# Patient Record
Sex: Male | Born: 1935 | Race: White | Hispanic: No | Smoking: Never smoker
Health system: Southern US, Community
[De-identification: ages and names within clinical notes are randomized; demographics above are authoritative.]

## PROBLEM LIST (undated history)

## (undated) DIAGNOSIS — M1990 Unspecified osteoarthritis, unspecified site: Secondary | ICD-10-CM

## (undated) DIAGNOSIS — J189 Pneumonia, unspecified organism: Secondary | ICD-10-CM

## (undated) DIAGNOSIS — J45909 Unspecified asthma, uncomplicated: Secondary | ICD-10-CM

## (undated) DIAGNOSIS — R011 Cardiac murmur, unspecified: Secondary | ICD-10-CM

---

## 2001-01-13 ENCOUNTER — Ambulatory Visit (HOSPITAL_COMMUNITY): Admission: RE | Admit: 2001-01-13 | Discharge: 2001-01-13 | Payer: Self-pay | Admitting: Thoracic Surgery

## 2001-01-13 ENCOUNTER — Encounter: Payer: Self-pay | Admitting: Thoracic Surgery

## 2004-12-24 ENCOUNTER — Ambulatory Visit: Payer: Self-pay | Admitting: Cardiology

## 2012-07-13 ENCOUNTER — Other Ambulatory Visit (HOSPITAL_COMMUNITY): Payer: Self-pay

## 2015-01-17 DIAGNOSIS — I10 Essential (primary) hypertension: Secondary | ICD-10-CM | POA: Diagnosis not present

## 2015-01-17 DIAGNOSIS — G459 Transient cerebral ischemic attack, unspecified: Secondary | ICD-10-CM | POA: Diagnosis not present

## 2015-01-17 DIAGNOSIS — M1990 Unspecified osteoarthritis, unspecified site: Secondary | ICD-10-CM | POA: Diagnosis not present

## 2015-01-17 DIAGNOSIS — E782 Mixed hyperlipidemia: Secondary | ICD-10-CM | POA: Diagnosis not present

## 2015-01-22 DIAGNOSIS — Z0001 Encounter for general adult medical examination with abnormal findings: Secondary | ICD-10-CM | POA: Diagnosis not present

## 2015-01-22 DIAGNOSIS — J301 Allergic rhinitis due to pollen: Secondary | ICD-10-CM | POA: Diagnosis not present

## 2015-01-28 DIAGNOSIS — R3915 Urgency of urination: Secondary | ICD-10-CM | POA: Diagnosis not present

## 2015-01-28 DIAGNOSIS — N480 Leukoplakia of penis: Secondary | ICD-10-CM | POA: Diagnosis not present

## 2015-07-01 DIAGNOSIS — Z23 Encounter for immunization: Secondary | ICD-10-CM | POA: Diagnosis not present

## 2015-07-31 DIAGNOSIS — J301 Allergic rhinitis due to pollen: Secondary | ICD-10-CM | POA: Diagnosis not present

## 2015-07-31 DIAGNOSIS — N400 Enlarged prostate without lower urinary tract symptoms: Secondary | ICD-10-CM | POA: Diagnosis not present

## 2015-07-31 DIAGNOSIS — J4530 Mild persistent asthma, uncomplicated: Secondary | ICD-10-CM | POA: Diagnosis not present

## 2015-07-31 DIAGNOSIS — E782 Mixed hyperlipidemia: Secondary | ICD-10-CM | POA: Diagnosis not present

## 2015-07-31 DIAGNOSIS — I10 Essential (primary) hypertension: Secondary | ICD-10-CM | POA: Diagnosis not present

## 2015-09-09 DIAGNOSIS — J40 Bronchitis, not specified as acute or chronic: Secondary | ICD-10-CM | POA: Diagnosis not present

## 2015-09-09 DIAGNOSIS — J0100 Acute maxillary sinusitis, unspecified: Secondary | ICD-10-CM | POA: Diagnosis not present

## 2015-10-23 DIAGNOSIS — J4541 Moderate persistent asthma with (acute) exacerbation: Secondary | ICD-10-CM | POA: Diagnosis not present

## 2015-10-23 DIAGNOSIS — J301 Allergic rhinitis due to pollen: Secondary | ICD-10-CM | POA: Diagnosis not present

## 2016-01-27 DIAGNOSIS — N480 Leukoplakia of penis: Secondary | ICD-10-CM | POA: Diagnosis not present

## 2016-01-27 DIAGNOSIS — R3915 Urgency of urination: Secondary | ICD-10-CM | POA: Diagnosis not present

## 2016-01-29 DIAGNOSIS — E782 Mixed hyperlipidemia: Secondary | ICD-10-CM | POA: Diagnosis not present

## 2016-01-29 DIAGNOSIS — M1990 Unspecified osteoarthritis, unspecified site: Secondary | ICD-10-CM | POA: Diagnosis not present

## 2016-01-29 DIAGNOSIS — I10 Essential (primary) hypertension: Secondary | ICD-10-CM | POA: Diagnosis not present

## 2016-02-02 DIAGNOSIS — I10 Essential (primary) hypertension: Secondary | ICD-10-CM | POA: Diagnosis not present

## 2016-02-02 DIAGNOSIS — J301 Allergic rhinitis due to pollen: Secondary | ICD-10-CM | POA: Diagnosis not present

## 2016-02-02 DIAGNOSIS — J4530 Mild persistent asthma, uncomplicated: Secondary | ICD-10-CM | POA: Diagnosis not present

## 2016-02-02 DIAGNOSIS — E782 Mixed hyperlipidemia: Secondary | ICD-10-CM | POA: Diagnosis not present

## 2016-02-02 DIAGNOSIS — N400 Enlarged prostate without lower urinary tract symptoms: Secondary | ICD-10-CM | POA: Diagnosis not present

## 2016-05-24 DIAGNOSIS — Z23 Encounter for immunization: Secondary | ICD-10-CM | POA: Diagnosis not present

## 2016-09-09 DIAGNOSIS — E782 Mixed hyperlipidemia: Secondary | ICD-10-CM | POA: Diagnosis not present

## 2016-09-09 DIAGNOSIS — G459 Transient cerebral ischemic attack, unspecified: Secondary | ICD-10-CM | POA: Diagnosis not present

## 2016-09-09 DIAGNOSIS — I10 Essential (primary) hypertension: Secondary | ICD-10-CM | POA: Diagnosis not present

## 2016-09-14 DIAGNOSIS — E782 Mixed hyperlipidemia: Secondary | ICD-10-CM | POA: Diagnosis not present

## 2016-09-14 DIAGNOSIS — Z1212 Encounter for screening for malignant neoplasm of rectum: Secondary | ICD-10-CM | POA: Diagnosis not present

## 2016-09-14 DIAGNOSIS — N400 Enlarged prostate without lower urinary tract symptoms: Secondary | ICD-10-CM | POA: Diagnosis not present

## 2016-09-14 DIAGNOSIS — Z0001 Encounter for general adult medical examination with abnormal findings: Secondary | ICD-10-CM | POA: Diagnosis not present

## 2016-09-14 DIAGNOSIS — J4530 Mild persistent asthma, uncomplicated: Secondary | ICD-10-CM | POA: Diagnosis not present

## 2016-09-14 DIAGNOSIS — I10 Essential (primary) hypertension: Secondary | ICD-10-CM | POA: Diagnosis not present

## 2016-09-30 DIAGNOSIS — R195 Other fecal abnormalities: Secondary | ICD-10-CM | POA: Diagnosis not present

## 2016-10-04 DIAGNOSIS — Z8249 Family history of ischemic heart disease and other diseases of the circulatory system: Secondary | ICD-10-CM | POA: Diagnosis not present

## 2016-10-04 DIAGNOSIS — Z96641 Presence of right artificial hip joint: Secondary | ICD-10-CM | POA: Diagnosis not present

## 2016-10-04 DIAGNOSIS — I10 Essential (primary) hypertension: Secondary | ICD-10-CM | POA: Diagnosis not present

## 2016-10-04 DIAGNOSIS — Z823 Family history of stroke: Secondary | ICD-10-CM | POA: Diagnosis not present

## 2016-10-04 DIAGNOSIS — J45909 Unspecified asthma, uncomplicated: Secondary | ICD-10-CM | POA: Diagnosis not present

## 2016-10-04 DIAGNOSIS — Z881 Allergy status to other antibiotic agents status: Secondary | ICD-10-CM | POA: Diagnosis not present

## 2016-10-04 DIAGNOSIS — E785 Hyperlipidemia, unspecified: Secondary | ICD-10-CM | POA: Diagnosis not present

## 2016-10-04 DIAGNOSIS — Z882 Allergy status to sulfonamides status: Secondary | ICD-10-CM | POA: Diagnosis not present

## 2016-10-04 DIAGNOSIS — Z8489 Family history of other specified conditions: Secondary | ICD-10-CM | POA: Diagnosis not present

## 2016-10-04 DIAGNOSIS — Z79899 Other long term (current) drug therapy: Secondary | ICD-10-CM | POA: Diagnosis not present

## 2016-10-04 DIAGNOSIS — N400 Enlarged prostate without lower urinary tract symptoms: Secondary | ICD-10-CM | POA: Diagnosis not present

## 2016-10-04 DIAGNOSIS — Z7951 Long term (current) use of inhaled steroids: Secondary | ICD-10-CM | POA: Diagnosis not present

## 2016-10-04 DIAGNOSIS — R195 Other fecal abnormalities: Secondary | ICD-10-CM | POA: Diagnosis not present

## 2016-10-04 DIAGNOSIS — Z7982 Long term (current) use of aspirin: Secondary | ICD-10-CM | POA: Diagnosis not present

## 2016-10-18 DIAGNOSIS — R195 Other fecal abnormalities: Secondary | ICD-10-CM | POA: Diagnosis not present

## 2016-12-31 DIAGNOSIS — J309 Allergic rhinitis, unspecified: Secondary | ICD-10-CM | POA: Diagnosis not present

## 2016-12-31 DIAGNOSIS — R05 Cough: Secondary | ICD-10-CM | POA: Diagnosis not present

## 2017-03-09 DIAGNOSIS — L0231 Cutaneous abscess of buttock: Secondary | ICD-10-CM | POA: Diagnosis not present

## 2017-03-22 DIAGNOSIS — J4530 Mild persistent asthma, uncomplicated: Secondary | ICD-10-CM | POA: Diagnosis not present

## 2017-03-22 DIAGNOSIS — N400 Enlarged prostate without lower urinary tract symptoms: Secondary | ICD-10-CM | POA: Diagnosis not present

## 2017-03-22 DIAGNOSIS — E782 Mixed hyperlipidemia: Secondary | ICD-10-CM | POA: Diagnosis not present

## 2017-03-22 DIAGNOSIS — I10 Essential (primary) hypertension: Secondary | ICD-10-CM | POA: Diagnosis not present

## 2017-03-22 DIAGNOSIS — J301 Allergic rhinitis due to pollen: Secondary | ICD-10-CM | POA: Diagnosis not present

## 2017-04-25 DIAGNOSIS — L0231 Cutaneous abscess of buttock: Secondary | ICD-10-CM | POA: Diagnosis not present

## 2017-06-03 DIAGNOSIS — J309 Allergic rhinitis, unspecified: Secondary | ICD-10-CM | POA: Diagnosis not present

## 2017-06-03 DIAGNOSIS — Z23 Encounter for immunization: Secondary | ICD-10-CM | POA: Diagnosis not present

## 2017-06-09 DIAGNOSIS — N433 Hydrocele, unspecified: Secondary | ICD-10-CM | POA: Diagnosis not present

## 2017-06-25 DIAGNOSIS — N430 Encysted hydrocele: Secondary | ICD-10-CM | POA: Diagnosis not present

## 2017-09-23 DIAGNOSIS — M1990 Unspecified osteoarthritis, unspecified site: Secondary | ICD-10-CM | POA: Diagnosis not present

## 2017-09-23 DIAGNOSIS — G459 Transient cerebral ischemic attack, unspecified: Secondary | ICD-10-CM | POA: Diagnosis not present

## 2017-09-23 DIAGNOSIS — I10 Essential (primary) hypertension: Secondary | ICD-10-CM | POA: Diagnosis not present

## 2017-09-23 DIAGNOSIS — E782 Mixed hyperlipidemia: Secondary | ICD-10-CM | POA: Diagnosis not present

## 2017-09-27 DIAGNOSIS — I10 Essential (primary) hypertension: Secondary | ICD-10-CM | POA: Diagnosis not present

## 2017-09-27 DIAGNOSIS — N400 Enlarged prostate without lower urinary tract symptoms: Secondary | ICD-10-CM | POA: Diagnosis not present

## 2017-09-27 DIAGNOSIS — Z0001 Encounter for general adult medical examination with abnormal findings: Secondary | ICD-10-CM | POA: Diagnosis not present

## 2017-09-27 DIAGNOSIS — Z23 Encounter for immunization: Secondary | ICD-10-CM | POA: Diagnosis not present

## 2017-09-27 DIAGNOSIS — E782 Mixed hyperlipidemia: Secondary | ICD-10-CM | POA: Diagnosis not present

## 2017-10-19 DIAGNOSIS — L0231 Cutaneous abscess of buttock: Secondary | ICD-10-CM | POA: Diagnosis not present

## 2017-11-23 DIAGNOSIS — J301 Allergic rhinitis due to pollen: Secondary | ICD-10-CM | POA: Diagnosis not present

## 2017-12-12 DIAGNOSIS — R0602 Shortness of breath: Secondary | ICD-10-CM | POA: Diagnosis not present

## 2017-12-12 DIAGNOSIS — J0100 Acute maxillary sinusitis, unspecified: Secondary | ICD-10-CM | POA: Diagnosis not present

## 2017-12-20 DIAGNOSIS — H35371 Puckering of macula, right eye: Secondary | ICD-10-CM | POA: Diagnosis not present

## 2018-03-27 DIAGNOSIS — E782 Mixed hyperlipidemia: Secondary | ICD-10-CM | POA: Diagnosis not present

## 2018-03-27 DIAGNOSIS — I10 Essential (primary) hypertension: Secondary | ICD-10-CM | POA: Diagnosis not present

## 2018-03-27 DIAGNOSIS — G459 Transient cerebral ischemic attack, unspecified: Secondary | ICD-10-CM | POA: Diagnosis not present

## 2018-04-03 DIAGNOSIS — E782 Mixed hyperlipidemia: Secondary | ICD-10-CM | POA: Diagnosis not present

## 2018-04-03 DIAGNOSIS — J4530 Mild persistent asthma, uncomplicated: Secondary | ICD-10-CM | POA: Diagnosis not present

## 2018-04-03 DIAGNOSIS — D692 Other nonthrombocytopenic purpura: Secondary | ICD-10-CM | POA: Diagnosis not present

## 2018-04-03 DIAGNOSIS — I10 Essential (primary) hypertension: Secondary | ICD-10-CM | POA: Diagnosis not present

## 2018-06-08 DIAGNOSIS — Z23 Encounter for immunization: Secondary | ICD-10-CM | POA: Diagnosis not present

## 2018-07-10 DIAGNOSIS — A084 Viral intestinal infection, unspecified: Secondary | ICD-10-CM | POA: Diagnosis not present

## 2018-07-10 DIAGNOSIS — J301 Allergic rhinitis due to pollen: Secondary | ICD-10-CM | POA: Diagnosis not present

## 2018-08-07 DIAGNOSIS — M25551 Pain in right hip: Secondary | ICD-10-CM | POA: Diagnosis not present

## 2018-08-07 DIAGNOSIS — L02214 Cutaneous abscess of groin: Secondary | ICD-10-CM | POA: Diagnosis not present

## 2018-10-06 DIAGNOSIS — D509 Iron deficiency anemia, unspecified: Secondary | ICD-10-CM | POA: Diagnosis not present

## 2018-10-06 DIAGNOSIS — G459 Transient cerebral ischemic attack, unspecified: Secondary | ICD-10-CM | POA: Diagnosis not present

## 2018-10-06 DIAGNOSIS — D519 Vitamin B12 deficiency anemia, unspecified: Secondary | ICD-10-CM | POA: Diagnosis not present

## 2018-10-06 DIAGNOSIS — N183 Chronic kidney disease, stage 3 (moderate): Secondary | ICD-10-CM | POA: Diagnosis not present

## 2018-10-06 DIAGNOSIS — Z7689 Persons encountering health services in other specified circumstances: Secondary | ICD-10-CM | POA: Diagnosis not present

## 2018-10-06 DIAGNOSIS — I10 Essential (primary) hypertension: Secondary | ICD-10-CM | POA: Diagnosis not present

## 2018-10-06 DIAGNOSIS — D692 Other nonthrombocytopenic purpura: Secondary | ICD-10-CM | POA: Diagnosis not present

## 2018-10-06 DIAGNOSIS — D529 Folate deficiency anemia, unspecified: Secondary | ICD-10-CM | POA: Diagnosis not present

## 2018-10-06 DIAGNOSIS — E782 Mixed hyperlipidemia: Secondary | ICD-10-CM | POA: Diagnosis not present

## 2018-10-10 DIAGNOSIS — Z0001 Encounter for general adult medical examination with abnormal findings: Secondary | ICD-10-CM | POA: Diagnosis not present

## 2018-10-10 DIAGNOSIS — Z23 Encounter for immunization: Secondary | ICD-10-CM | POA: Diagnosis not present

## 2018-10-10 DIAGNOSIS — I10 Essential (primary) hypertension: Secondary | ICD-10-CM | POA: Diagnosis not present

## 2018-10-10 DIAGNOSIS — Z1212 Encounter for screening for malignant neoplasm of rectum: Secondary | ICD-10-CM | POA: Diagnosis not present

## 2018-10-23 DIAGNOSIS — J301 Allergic rhinitis due to pollen: Secondary | ICD-10-CM | POA: Diagnosis not present

## 2018-11-21 DIAGNOSIS — D509 Iron deficiency anemia, unspecified: Secondary | ICD-10-CM | POA: Diagnosis not present

## 2018-11-21 DIAGNOSIS — N183 Chronic kidney disease, stage 3 (moderate): Secondary | ICD-10-CM | POA: Diagnosis not present

## 2019-01-03 DIAGNOSIS — N183 Chronic kidney disease, stage 3 (moderate): Secondary | ICD-10-CM | POA: Diagnosis not present

## 2019-01-03 DIAGNOSIS — I10 Essential (primary) hypertension: Secondary | ICD-10-CM | POA: Diagnosis not present

## 2019-01-03 DIAGNOSIS — D509 Iron deficiency anemia, unspecified: Secondary | ICD-10-CM | POA: Diagnosis not present

## 2019-01-09 DIAGNOSIS — Z23 Encounter for immunization: Secondary | ICD-10-CM | POA: Diagnosis not present

## 2019-03-01 DIAGNOSIS — D529 Folate deficiency anemia, unspecified: Secondary | ICD-10-CM | POA: Diagnosis not present

## 2019-03-01 DIAGNOSIS — D519 Vitamin B12 deficiency anemia, unspecified: Secondary | ICD-10-CM | POA: Diagnosis not present

## 2019-03-01 DIAGNOSIS — D649 Anemia, unspecified: Secondary | ICD-10-CM | POA: Diagnosis not present

## 2019-04-09 DIAGNOSIS — Z0000 Encounter for general adult medical examination without abnormal findings: Secondary | ICD-10-CM | POA: Diagnosis not present

## 2019-04-09 DIAGNOSIS — N183 Chronic kidney disease, stage 3 (moderate): Secondary | ICD-10-CM | POA: Diagnosis not present

## 2019-04-09 DIAGNOSIS — D649 Anemia, unspecified: Secondary | ICD-10-CM | POA: Diagnosis not present

## 2019-04-09 DIAGNOSIS — D529 Folate deficiency anemia, unspecified: Secondary | ICD-10-CM | POA: Diagnosis not present

## 2019-04-09 DIAGNOSIS — R5383 Other fatigue: Secondary | ICD-10-CM | POA: Diagnosis not present

## 2019-04-09 DIAGNOSIS — D519 Vitamin B12 deficiency anemia, unspecified: Secondary | ICD-10-CM | POA: Diagnosis not present

## 2019-04-12 DIAGNOSIS — E782 Mixed hyperlipidemia: Secondary | ICD-10-CM | POA: Diagnosis not present

## 2019-04-12 DIAGNOSIS — J4540 Moderate persistent asthma, uncomplicated: Secondary | ICD-10-CM | POA: Diagnosis not present

## 2019-04-12 DIAGNOSIS — I10 Essential (primary) hypertension: Secondary | ICD-10-CM | POA: Diagnosis not present

## 2019-04-12 DIAGNOSIS — D692 Other nonthrombocytopenic purpura: Secondary | ICD-10-CM | POA: Diagnosis not present

## 2019-05-17 DIAGNOSIS — Z23 Encounter for immunization: Secondary | ICD-10-CM | POA: Diagnosis not present

## 2019-08-30 DIAGNOSIS — I10 Essential (primary) hypertension: Secondary | ICD-10-CM | POA: Diagnosis not present

## 2019-08-30 DIAGNOSIS — E782 Mixed hyperlipidemia: Secondary | ICD-10-CM | POA: Diagnosis not present

## 2019-09-28 DIAGNOSIS — I10 Essential (primary) hypertension: Secondary | ICD-10-CM | POA: Diagnosis not present

## 2019-09-28 DIAGNOSIS — E789 Disorder of lipoprotein metabolism, unspecified: Secondary | ICD-10-CM | POA: Diagnosis not present

## 2019-10-15 DIAGNOSIS — Z0001 Encounter for general adult medical examination with abnormal findings: Secondary | ICD-10-CM | POA: Diagnosis not present

## 2019-10-15 DIAGNOSIS — E782 Mixed hyperlipidemia: Secondary | ICD-10-CM | POA: Diagnosis not present

## 2019-10-15 DIAGNOSIS — I10 Essential (primary) hypertension: Secondary | ICD-10-CM | POA: Diagnosis not present

## 2019-10-15 DIAGNOSIS — D692 Other nonthrombocytopenic purpura: Secondary | ICD-10-CM | POA: Diagnosis not present

## 2019-10-26 DIAGNOSIS — E7849 Other hyperlipidemia: Secondary | ICD-10-CM | POA: Diagnosis not present

## 2019-10-26 DIAGNOSIS — I10 Essential (primary) hypertension: Secondary | ICD-10-CM | POA: Diagnosis not present

## 2019-12-28 DIAGNOSIS — E7849 Other hyperlipidemia: Secondary | ICD-10-CM | POA: Diagnosis not present

## 2019-12-28 DIAGNOSIS — I10 Essential (primary) hypertension: Secondary | ICD-10-CM | POA: Diagnosis not present

## 2019-12-28 DIAGNOSIS — N183 Chronic kidney disease, stage 3 unspecified: Secondary | ICD-10-CM | POA: Diagnosis not present

## 2020-02-06 DIAGNOSIS — J4540 Moderate persistent asthma, uncomplicated: Secondary | ICD-10-CM | POA: Diagnosis not present

## 2020-02-06 DIAGNOSIS — I10 Essential (primary) hypertension: Secondary | ICD-10-CM | POA: Diagnosis not present

## 2020-02-06 DIAGNOSIS — J301 Allergic rhinitis due to pollen: Secondary | ICD-10-CM | POA: Diagnosis not present

## 2020-02-06 DIAGNOSIS — E782 Mixed hyperlipidemia: Secondary | ICD-10-CM | POA: Diagnosis not present

## 2020-02-06 DIAGNOSIS — D692 Other nonthrombocytopenic purpura: Secondary | ICD-10-CM | POA: Diagnosis not present

## 2020-02-06 DIAGNOSIS — Z0001 Encounter for general adult medical examination with abnormal findings: Secondary | ICD-10-CM | POA: Diagnosis not present

## 2020-02-18 DIAGNOSIS — S20212A Contusion of left front wall of thorax, initial encounter: Secondary | ICD-10-CM | POA: Diagnosis not present

## 2020-02-22 DIAGNOSIS — R300 Dysuria: Secondary | ICD-10-CM | POA: Diagnosis not present

## 2020-02-22 DIAGNOSIS — R3912 Poor urinary stream: Secondary | ICD-10-CM | POA: Diagnosis not present

## 2020-02-22 DIAGNOSIS — R350 Frequency of micturition: Secondary | ICD-10-CM | POA: Diagnosis not present

## 2020-02-22 DIAGNOSIS — R3914 Feeling of incomplete bladder emptying: Secondary | ICD-10-CM | POA: Diagnosis not present

## 2020-02-27 DIAGNOSIS — E7849 Other hyperlipidemia: Secondary | ICD-10-CM | POA: Diagnosis not present

## 2020-02-27 DIAGNOSIS — D509 Iron deficiency anemia, unspecified: Secondary | ICD-10-CM | POA: Diagnosis not present

## 2020-02-27 DIAGNOSIS — I129 Hypertensive chronic kidney disease with stage 1 through stage 4 chronic kidney disease, or unspecified chronic kidney disease: Secondary | ICD-10-CM | POA: Diagnosis not present

## 2020-02-27 DIAGNOSIS — N183 Chronic kidney disease, stage 3 unspecified: Secondary | ICD-10-CM | POA: Diagnosis not present

## 2020-04-17 ENCOUNTER — Ambulatory Visit: Payer: Self-pay | Admitting: Urology

## 2020-04-29 DIAGNOSIS — N183 Chronic kidney disease, stage 3 unspecified: Secondary | ICD-10-CM | POA: Diagnosis not present

## 2020-04-29 DIAGNOSIS — D509 Iron deficiency anemia, unspecified: Secondary | ICD-10-CM | POA: Diagnosis not present

## 2020-04-29 DIAGNOSIS — E7849 Other hyperlipidemia: Secondary | ICD-10-CM | POA: Diagnosis not present

## 2020-04-29 DIAGNOSIS — I129 Hypertensive chronic kidney disease with stage 1 through stage 4 chronic kidney disease, or unspecified chronic kidney disease: Secondary | ICD-10-CM | POA: Diagnosis not present

## 2020-05-12 ENCOUNTER — Ambulatory Visit (INDEPENDENT_AMBULATORY_CARE_PROVIDER_SITE_OTHER): Payer: Medicare Other | Admitting: Urology

## 2020-05-12 ENCOUNTER — Encounter: Payer: Self-pay | Admitting: Urology

## 2020-05-12 ENCOUNTER — Other Ambulatory Visit: Payer: Self-pay

## 2020-05-12 VITALS — Temp 98.1°F | Ht 75.0 in | Wt 205.0 lb

## 2020-05-12 DIAGNOSIS — N401 Enlarged prostate with lower urinary tract symptoms: Secondary | ICD-10-CM | POA: Insufficient documentation

## 2020-05-12 DIAGNOSIS — R3914 Feeling of incomplete bladder emptying: Secondary | ICD-10-CM | POA: Diagnosis not present

## 2020-05-12 DIAGNOSIS — N5201 Erectile dysfunction due to arterial insufficiency: Secondary | ICD-10-CM

## 2020-05-12 LAB — URINALYSIS, ROUTINE W REFLEX MICROSCOPIC
Bilirubin, UA: NEGATIVE
Glucose, UA: NEGATIVE
Ketones, UA: NEGATIVE
Leukocytes,UA: NEGATIVE
Nitrite, UA: NEGATIVE
Protein,UA: NEGATIVE
RBC, UA: NEGATIVE
Specific Gravity, UA: 1.015 (ref 1.005–1.030)
Urobilinogen, Ur: 0.2 mg/dL (ref 0.2–1.0)
pH, UA: 7 (ref 5.0–7.5)

## 2020-05-12 LAB — BLADDER SCAN AMB NON-IMAGING: Scan Result: 141

## 2020-05-12 MED ORDER — TAMSULOSIN HCL 0.4 MG PO CAPS: 0.4000 mg | ORAL_CAPSULE | Freq: Every day | ORAL | 3 refills | Status: DC

## 2020-05-12 MED ORDER — CLOTRIMAZOLE-BETAMETHASONE 1-0.05 % EX CREA: 1.0000 "application " | TOPICAL_CREAM | Freq: Two times a day (BID) | CUTANEOUS | 0 refills | Status: DC

## 2020-05-12 MED ORDER — TADALAFIL 20 MG PO TABS: 20.0000 mg | ORAL_TABLET | Freq: Every day | ORAL | 1 refills | Status: DC | PRN

## 2020-05-12 NOTE — Progress Notes (Signed)
05/12/2020 3:32 PM   Timothy Quinn 02/26/1932 [id number]  Referring provider: No referring provider defined for this encounter.  followup BPH  HPI: [name] is a 53WC here for followup for BPH with weak stream. He was seen by Dr. Gloriann Loan at [facility] in JUne 2021 and place don 2 weeks of doxycycline and flomax 0.4mg  daily. While on the flomax the stream improved. Nocturia improved to 1x. Urgency and frequency resolved. He stopped the flomax 6 weeks ago when he ran out of the medication. The stream worsened and nocturia worsened to 2-3x. No dysuria or hematuria. UA normal today He has had issues getting an erection for over 2 years. He has never tried a PDE5. Good exercise tolerance    PMH: No past medical history on file.  Surgical History:   Home Medications:  Allergies as of 05/12/2020   Not on File     Medication List       Accurate as of May 12, 2020  3:32 PM. If you have any questions, ask your nurse or doctor.        Advair Diskus 250-50 MCG/DOSE Aepb Generic drug: Fluticasone-Salmeterol SMARTSIG:1 Unspecified By Mouth Twice Daily   tamsulosin 0.4 MG Caps capsule Commonly known as: FLOMAX Take 0.4 mg by mouth daily.       Allergies: Not on File  Family History: No family history on file.  Social History:  has no history on file for tobacco use, alcohol use, and drug use.  ROS: All other review of systems were reviewed and are negative except what is noted above in HPI  Physical Exam: Temp 98.1 F (36.7 C)   Ht 6\' 3"  (1.905 m)   Wt 205 lb (93 kg)   BMI 25.62 kg/m   Constitutional:  Alert and oriented, No acute distress. HEENT: [place] AT, moist mucus membranes.  Trachea midline, no masses. Cardiovascular: No clubbing, cyanosis, or edema. Respiratory: Normal respiratory effort, no increased work of breathing. GI: Abdomen is soft, nontender, nondistended, no abdominal masses GU: No CVA tenderness.  Lymph: No cervical or inguinal  lymphadenopathy. Skin: No rashes, bruises or suspicious lesions. Neurologic: Grossly intact, no focal deficits, moving all 4 extremities. Psychiatric: Normal mood and affect.  Laboratory Data: No results found for: WBC, HGB, HCT, MCV, PLT  No results found for: CREATININE  No results found for: PSA  No results found for: TESTOSTERONE  No results found for: HGBA1C  Urinalysis    Component Value Date/Time   APPEARANCEUR Clear 05/12/2020 1513   GLUCOSEU Negative 05/12/2020 1513   BILIRUBINUR Negative 05/12/2020 1513   PROTEINUR Negative 05/12/2020 1513   NITRITE Negative 05/12/2020 1513   LEUKOCYTESUR Negative 05/12/2020 1513    Lab Results  Component Value Date   LABMICR Comment 05/12/2020    Pertinent Imaging:  No results found for this or any previous visit.  No results found for this or any previous visit.  No results found for this or any previous visit.  No results found for this or any previous visit.  No results found for this or any previous visit.  No results found for this or any previous visit.  No results found for this or any previous visit.  No results found for this or any previous visit.   Assessment & Plan:    1. Feeling of incomplete bladder emptying -restart flomax 0.4mg  daily - BLADDER SCAN AMB NON-IMAGING - Urinalysis, Routine w reflex microscopic  2. Benign localized prostatic hyperplasia with lower urinary tract symptoms (  LUTS) -restart flomax 0.4mg  daily  3. Erectile dysfunction -tadalafil 20mg  prn  No follow-ups on file.  Nicolette Bang, MD  [facility]

## 2020-05-12 NOTE — Patient Instructions (Signed)

## 2020-05-12 NOTE — Progress Notes (Signed)
PVR=141  Urological Symptom Review  Patient is experiencing the following symptoms: none   Review of Systems  Gastrointestinal (upper)  : Negative for upper GI symptoms  Gastrointestinal (lower) : Negative for lower GI symptoms  Constitutional : Negative for symptoms  Skin: Negative for skin symptoms  Eyes: Negative for eye symptoms  Ear/Nose/Throat : Negative for Ear/Nose/Throat symptoms  Hematologic/Lymphatic: Negative for Hematologic/Lymphatic symptoms  Cardiovascular : Negative for cardiovascular symptoms  Respiratory : Negative for respiratory symptoms  Endocrine: Negative for endocrine symptoms  Musculoskeletal: Negative for musculoskeletal symptoms  Neurological: Negative for neurological symptoms  Psychologic: Negative for psychiatric symptoms

## 2020-05-29 DIAGNOSIS — E7849 Other hyperlipidemia: Secondary | ICD-10-CM | POA: Diagnosis not present

## 2020-05-29 DIAGNOSIS — I129 Hypertensive chronic kidney disease with stage 1 through stage 4 chronic kidney disease, or unspecified chronic kidney disease: Secondary | ICD-10-CM | POA: Diagnosis not present

## 2020-05-29 DIAGNOSIS — D509 Iron deficiency anemia, unspecified: Secondary | ICD-10-CM | POA: Diagnosis not present

## 2020-05-29 DIAGNOSIS — N183 Chronic kidney disease, stage 3 unspecified: Secondary | ICD-10-CM | POA: Diagnosis not present

## 2020-06-10 DIAGNOSIS — Z23 Encounter for immunization: Secondary | ICD-10-CM | POA: Diagnosis not present

## 2020-06-28 DIAGNOSIS — N183 Chronic kidney disease, stage 3 unspecified: Secondary | ICD-10-CM | POA: Diagnosis not present

## 2020-06-28 DIAGNOSIS — I129 Hypertensive chronic kidney disease with stage 1 through stage 4 chronic kidney disease, or unspecified chronic kidney disease: Secondary | ICD-10-CM | POA: Diagnosis not present

## 2020-06-28 DIAGNOSIS — E7849 Other hyperlipidemia: Secondary | ICD-10-CM | POA: Diagnosis not present

## 2020-07-23 DIAGNOSIS — Z23 Encounter for immunization: Secondary | ICD-10-CM | POA: Diagnosis not present

## 2020-08-06 DIAGNOSIS — D519 Vitamin B12 deficiency anemia, unspecified: Secondary | ICD-10-CM | POA: Diagnosis not present

## 2020-08-06 DIAGNOSIS — N183 Chronic kidney disease, stage 3 unspecified: Secondary | ICD-10-CM | POA: Diagnosis not present

## 2020-08-06 DIAGNOSIS — D649 Anemia, unspecified: Secondary | ICD-10-CM | POA: Diagnosis not present

## 2020-08-06 DIAGNOSIS — D529 Folate deficiency anemia, unspecified: Secondary | ICD-10-CM | POA: Diagnosis not present

## 2020-08-13 DIAGNOSIS — J4540 Moderate persistent asthma, uncomplicated: Secondary | ICD-10-CM | POA: Diagnosis not present

## 2020-08-13 DIAGNOSIS — I10 Essential (primary) hypertension: Secondary | ICD-10-CM | POA: Diagnosis not present

## 2020-08-13 DIAGNOSIS — D692 Other nonthrombocytopenic purpura: Secondary | ICD-10-CM | POA: Diagnosis not present

## 2020-08-13 DIAGNOSIS — E782 Mixed hyperlipidemia: Secondary | ICD-10-CM | POA: Diagnosis not present

## 2020-08-29 DIAGNOSIS — D509 Iron deficiency anemia, unspecified: Secondary | ICD-10-CM | POA: Diagnosis not present

## 2020-08-29 DIAGNOSIS — N183 Chronic kidney disease, stage 3 unspecified: Secondary | ICD-10-CM | POA: Diagnosis not present

## 2020-08-29 DIAGNOSIS — E7849 Other hyperlipidemia: Secondary | ICD-10-CM | POA: Diagnosis not present

## 2020-08-29 DIAGNOSIS — I129 Hypertensive chronic kidney disease with stage 1 through stage 4 chronic kidney disease, or unspecified chronic kidney disease: Secondary | ICD-10-CM | POA: Diagnosis not present

## 2020-09-03 DIAGNOSIS — Z1329 Encounter for screening for other suspected endocrine disorder: Secondary | ICD-10-CM | POA: Diagnosis not present

## 2020-09-03 DIAGNOSIS — I10 Essential (primary) hypertension: Secondary | ICD-10-CM | POA: Diagnosis not present

## 2020-09-03 DIAGNOSIS — N183 Chronic kidney disease, stage 3 unspecified: Secondary | ICD-10-CM | POA: Diagnosis not present

## 2020-09-11 DIAGNOSIS — R9389 Abnormal findings on diagnostic imaging of other specified body structures: Secondary | ICD-10-CM | POA: Diagnosis not present

## 2020-09-11 DIAGNOSIS — E349 Endocrine disorder, unspecified: Secondary | ICD-10-CM | POA: Diagnosis not present

## 2020-09-11 DIAGNOSIS — E213 Hyperparathyroidism, unspecified: Secondary | ICD-10-CM | POA: Diagnosis not present

## 2020-10-27 DIAGNOSIS — I129 Hypertensive chronic kidney disease with stage 1 through stage 4 chronic kidney disease, or unspecified chronic kidney disease: Secondary | ICD-10-CM | POA: Diagnosis not present

## 2020-10-27 DIAGNOSIS — D509 Iron deficiency anemia, unspecified: Secondary | ICD-10-CM | POA: Diagnosis not present

## 2020-10-27 DIAGNOSIS — E7849 Other hyperlipidemia: Secondary | ICD-10-CM | POA: Diagnosis not present

## 2020-10-27 DIAGNOSIS — N183 Chronic kidney disease, stage 3 unspecified: Secondary | ICD-10-CM | POA: Diagnosis not present

## 2020-10-28 ENCOUNTER — Ambulatory Visit: Payer: Self-pay | Admitting: Surgery

## 2020-10-28 DIAGNOSIS — E210 Primary hyperparathyroidism: Secondary | ICD-10-CM | POA: Diagnosis not present

## 2020-10-30 ENCOUNTER — Other Ambulatory Visit (HOSPITAL_COMMUNITY): Payer: Self-pay | Admitting: Surgery

## 2020-10-30 DIAGNOSIS — E210 Primary hyperparathyroidism: Secondary | ICD-10-CM

## 2020-11-12 ENCOUNTER — Ambulatory Visit: Payer: Self-pay | Admitting: Urology

## 2020-11-12 DIAGNOSIS — R3914 Feeling of incomplete bladder emptying: Secondary | ICD-10-CM

## 2020-11-17 ENCOUNTER — Encounter (HOSPITAL_COMMUNITY): Payer: Self-pay | Admitting: Surgery

## 2020-11-17 DIAGNOSIS — E210 Primary hyperparathyroidism: Secondary | ICD-10-CM | POA: Diagnosis present

## 2020-11-17 NOTE — H&P (Signed)
General Surgery [facility]  Timothy Quinn DOB: 09-28-1931 Widowed / Language: Timothy Quinn / Race: White Male   History of Present Illness   The patient is a 87 year old male who presents with primary hyperparathyroidism.  CHIEF COMPLAINT: primary hyperparathyroidism  Patient is referred by Dr. Gar Quinn for surgical evaluation and management of primary hyperparathyroidism. Patient was noted on routine laboratory studies to have an elevated serum calcium level of 10.9. Subsequent intact PTH level was noted to be elevated at 72. Patient underwent nuclear medicine parathyroid scan on September 11, 2020. This localized a left inferior parathyroid adenoma. Patient has experienced fatigue. He is having headaches. He denies nephrolithiasis. He has had no prior head or neck surgery. There is no family history of parathyroid disease. Patient is referred at this time for evaluation for minimally invasive parathyroidectomy. He is accompanied by his daughter.   Past Surgical History Cataract Surgery  Bilateral. Hip Surgery  Right.  Diagnostic Studies History  Colonoscopy  5-10 years ago  Allergies Sulfa Antibiotics  Cipro *FLUOROQUINOLONES*  Allergies Reconciled   Medication History  Advair Diskus (250-50MCG/DOSE Aero Pow Br Act, Inhalation) Active. Clotrimazole-Betamethasone (1-0.05% Cream, External) Active. Doxycycline Hyclate (100MG  Tablet, Oral) Active. Lisinopril-hydroCHLOROthiazide (10-12.5MG  Tablet, Oral) Active. Lisinopril-hydroCHLOROthiazide (20-25MG  Tablet, Oral) Active. Tadalafil (20MG  Tablet, Oral) Active. Tamsulosin HCl (0.4MG  Capsule, Oral) Active. ProAir HFA (108 (90 Base)MCG/ACT Aerosol Soln, Inhalation) Active. Medications Reconciled  Social History  Caffeine use  Carbonated beverages, Coffee. No alcohol use  No drug use  Tobacco use  Former smoker.  Family History  Bleeding disorder  Daughter. Cerebrovascular  Accident  Father. Migraine Headache  Daughter.  Other Problems  Arthritis  Asthma  Gastric Ulcer  Gastroesophageal Reflux Disease  Heart murmur  Hemorrhoids   Review of Systems  General Not Present- Appetite Loss, Chills, Fatigue, Fever, Night Sweats, Weight Gain and Weight Loss. Skin Not Present- Change in Wart/Mole, Dryness, Hives, Jaundice, New Lesions, Non-Healing Wounds, Rash and Ulcer. HEENT Present- Hearing Loss, Seasonal Allergies, Sinus Pain and Wears glasses/contact lenses. Not Present- Earache, Hoarseness, Nose Bleed, Oral Ulcers, Ringing in the Ears, Sore Throat, Visual Disturbances and Yellow Eyes. Respiratory Present- Snoring and Wheezing. Not Present- Bloody sputum, Chronic Cough and Difficulty Breathing. Breast Not Present- Breast Mass, Breast Pain, Nipple Discharge and Skin Changes. Cardiovascular Present- Shortness of Breath and Swelling of Extremities. Not Present- Chest Pain, Difficulty Breathing Lying Down, Leg Cramps, Palpitations and Rapid Heart Rate. Gastrointestinal Present- Bloating, Change in Bowel Habits, Gets full quickly at meals and Hemorrhoids. Not Present- Abdominal Pain, Bloody Stool, Chronic diarrhea, Constipation, Difficulty Swallowing, Excessive gas, Indigestion, Nausea, Rectal Pain and Vomiting. Male Genitourinary Present- Change in Urinary Stream, Frequency, Nocturia and Urgency. Not Present- Blood in Urine, Impotence, Painful Urination and Urine Leakage. Musculoskeletal Present- Back Pain, Joint Pain, Joint Stiffness and Swelling of Extremities. Not Present- Muscle Pain and Muscle Weakness. Neurological Present- Headaches. Not Present- Decreased Memory, Fainting, Numbness, Seizures, Tingling, Tremor, Trouble walking and Weakness. Psychiatric Not Present- Anxiety, Bipolar, Change in Sleep Pattern, Depression, Fearful and Frequent crying. Endocrine Not Present- Cold Intolerance, Excessive Hunger, Hair Changes, Heat Intolerance, Hot flashes and New  Diabetes. Hematology Present- Blood Thinners and Easy Bruising. Not Present- Excessive bleeding, Gland problems, HIV and Persistent Infections.  Vitals  Weight: 215 lb Temp.: 98.28F  Pulse: 85 (Regular)  P.OX: 95% (Room air) BP: 160/80(Sitting, Left Arm, Standard)  Physical Exam   GENERAL APPEARANCE Development: normal Nutritional status: normal Gross deformities: none  SKIN Rash, lesions, ulcers:  none Induration, erythema: none Nodules: none palpable  EYES Conjunctiva and lids: normal Pupils: equal and reactive Iris: normal bilaterally  EARS, NOSE, MOUTH, THROAT External ears: no lesion or deformity External nose: no lesion or deformity Hearing: grossly normal Due to Covid-19 pandemic, patient is wearing a mask.  NECK Symmetric: yes Trachea: midline Thyroid: no palpable nodules in the thyroid bed  CHEST Respiratory effort: normal Retraction or accessory muscle use: no Breath sounds: normal bilaterally Rales, rhonchi, wheeze: none  CARDIOVASCULAR Auscultation: regular rhythm, normal rate Murmurs: none Pulses: radial pulse 2+ palpable Lower extremity edema: none  MUSCULOSKELETAL Station and gait: normal Digits and nails: no clubbing or cyanosis Muscle strength: grossly normal all extremities Range of motion: grossly normal all extremities Deformity: none  LYMPHATIC Cervical: none palpable Supraclavicular: none palpable  PSYCHIATRIC Oriented to person, place, and time: yes Mood and affect: normal for situation Judgment and insight: appropriate for situation    Assessment & Plan   PRIMARY HYPERPARATHYROIDISM (E21.0)  Patient is referred by his primary care physician for surgical evaluation and management of primary hyperparathyroidism.  Patient provided with a copy of "Parathyroid Surgery: Treatment for Your Parathyroid Gland Problem", published by [employer], 12 pages. Book reviewed and explained to patient during visit today.  Patient has  biochemical evidence of primary hyperparathyroidism. He has experienced chronic fatigue. Imaging studies localize a left inferior parathyroid adenoma. I would like to obtain an ultrasound examination of the neck to confirm the location of the adenoma and to rule out any concurrent thyroid disease. We discussed proceeding with minimally invasive parathyroidectomy as an outpatient surgical procedure. We discussed the size and location of the surgical incision. We discussed the hospital stay and his postoperative recovery. We discussed his return to physical activity. He understands and wishes to proceed with surgery in the near future. We will make arrangements for his ultrasound study in the near future as well.  The risks and benefits of the procedure have been discussed at length with the patient. The patient understands the proposed procedure, potential alternative treatments, and the course of recovery to be expected. All of the patient's questions have been answered at this time. The patient wishes to proceed with surgery.  Armandina Gemma, MD [facility] Office: [phone]

## 2020-11-19 NOTE — Progress Notes (Addendum)
DUE TO COVID-19 ONLY ONE VISITOR IS ALLOWED TO COME WITH YOU AND STAY IN THE WAITING ROOM ONLY DURING PRE OP AND PROCEDURE DAY OF SURGERY. THE 1 VISITOR  MAY VISIT WITH YOU AFTER SURGERY IN YOUR PRIVATE ROOM DURING VISITING HOURS ONLY!  YOU NEED TO HAVE A COVID 19 TEST ON__3/25/2022 _____ @_______ , THIS TEST MUST BE DONE BEFORE SURGERY,  COVID TESTING SITE [address] [place] [address], IT IS ON THE RIGHT GOING OUT [address] APPROXIMATELY  2 MINUTES PAST [employer] ON THE RIGHT. ONCE YOUR COVID TEST IS COMPLETED,  PLEASE BEGIN THE QUARANTINE INSTRUCTIONS AS OUTLINED IN YOUR HANDOUT.                Timothy Quinn  11/19/2020   Your procedure is scheduled on:    Report to [facility] Main  Entrance   Report to admitting at   [place] AM     Call this number if you have problems the morning of surgery [phone]    REMEMBER: NO  SOLID FOOD CANDY OR GUM AFTER MIDNIGHT. CLEAR LIQUIDS UNTIL   0745am       . NOTHING BY MOUTH EXCEPT CLEAR LIQUIDS UNTIL      0745am    . PLEASE FINISH ENSURE DRINK PER SURGEON ORDER  WHICH NEEDS TO BE COMPLETED AT   [id number]    .      CLEAR LIQUID DIET   Foods Allowed                                                                    Coffee and tea, regular and decaf                            Fruit ices (not with fruit pulp)                                      Iced Popsicles                                    Carbonated beverages, regular and diet                                    Cranberry, grape and apple juices Sports drinks like Gatorade Lightly seasoned clear broth or consume(fat free) Sugar, honey syrup ___________________________________________________________________      BRUSH YOUR TEETH MORNING OF SURGERY AND RINSE YOUR MOUTH OUT, NO CHEWING GUM CANDY OR MINTS.     Take these medicines the morning of surgery with A SIP OF WATER:  Inhalers as usual and bring   DO NOT TAKE ANY DIABETIC MEDICATIONS  DAY OF YOUR SURGERY                               You may not have any metal on your body including hair pins and  piercings  Do not wear jewelry, make-up, lotions, powders or perfumes, deodorant             Do not wear nail polish on your fingernails.  Do not shave  48 hours prior to surgery.              Men may shave face and neck.   Do not bring valuables to the hospital. [place].  Contacts, dentures or bridgework may not be worn into surgery.  Leave suitcase in the car. After surgery it may be brought to your room.     Patients discharged the day of surgery will not be allowed to drive home. IF YOU ARE HAVING SURGERY AND GOING HOME THE SAME DAY, YOU MUST HAVE AN ADULT TO DRIVE YOU HOME AND BE WITH YOU FOR 24 HOURS. YOU MAY GO HOME BY TAXI OR UBER OR ORTHERWISE, BUT AN ADULT MUST ACCOMPANY YOU HOME AND STAY WITH YOU FOR 24 HOURS.  Name and phone number of your driver:  Special Instructions: N/A              Please read over the following fact sheets you were given: _____________________________________________________________________  [employer] - Preparing for Surgery Before surgery, you can play an important role.  Because skin is not sterile, your skin needs to be as free of germs as possible.  You can reduce the number of germs on your skin by washing with CHG (chlorahexidine gluconate) soap before surgery.  CHG is an antiseptic cleaner which kills germs and bonds with the skin to continue killing germs even after washing. Please DO NOT use if you have an allergy to CHG or antibacterial soaps.  If your skin becomes reddened/irritated stop using the CHG and inform your nurse when you arrive at Short Stay. Do not shave (including legs and underarms) for at least 48 hours prior to the first CHG shower.  You may shave your face/neck. Please follow these instructions carefully:  1.  Shower with CHG Soap the night before  surgery and the  morning of Surgery.  2.  If you choose to wash your hair, wash your hair first as usual with your  normal  shampoo.  3.  After you shampoo, rinse your hair and body thoroughly to remove the  shampoo.                           4.  Use CHG as you would any other liquid soap.  You can apply chg directly  to the skin and wash                       Gently with a scrungie or clean washcloth.  5.  Apply the CHG Soap to your body ONLY FROM THE NECK DOWN.   Do not use on face/ open                           Wound or open sores. Avoid contact with eyes, ears mouth and genitals (private parts).                       Wash face,  Genitals (private parts) with your normal soap.             6.  Wash thoroughly, paying special attention to the area where your surgery  will be performed.  7.  Thoroughly rinse your body with warm water from the neck down.  8.  DO NOT shower/wash with your normal soap after using and rinsing off  the CHG Soap.                9.  Pat yourself dry with a clean towel.            10.  Wear clean pajamas.            11.  Place clean sheets on your bed the night of your first shower and do not  sleep with pets. Day of Surgery : Do not apply any lotions/deodorants the morning of surgery.  Please wear clean clothes to the hospital/surgery center.  FAILURE TO FOLLOW THESE INSTRUCTIONS MAY RESULT IN THE CANCELLATION OF YOUR SURGERY PATIENT SIGNATURE_________________________________  NURSE SIGNATURE__________________________________  ________________________________________________________________________

## 2020-11-20 ENCOUNTER — Other Ambulatory Visit: Payer: Self-pay

## 2020-11-20 ENCOUNTER — Ambulatory Visit (HOSPITAL_COMMUNITY)
Admission: RE | Admit: 2020-11-20 | Discharge: 2020-11-20 | Disposition: A | Discharge status: Home / Self Care | Payer: Medicare Other | Source: Ambulatory Visit | Attending: Surgery | Admitting: Surgery

## 2020-11-20 DIAGNOSIS — E210 Primary hyperparathyroidism: Secondary | ICD-10-CM | POA: Insufficient documentation

## 2020-11-21 ENCOUNTER — Other Ambulatory Visit: Payer: Self-pay

## 2020-11-21 ENCOUNTER — Encounter (HOSPITAL_COMMUNITY)
Admission: RE | Admit: 2020-11-21 | Discharge: 2020-11-21 | Disposition: A | Discharge status: Home / Self Care | Payer: Medicare Other | Source: Ambulatory Visit | Attending: Surgery | Admitting: Surgery

## 2020-11-21 ENCOUNTER — Other Ambulatory Visit (HOSPITAL_COMMUNITY)
Admission: RE | Admit: 2020-11-21 | Discharge: 2020-11-21 | Disposition: A | Discharge status: Home / Self Care | Payer: Medicare Other | Source: Ambulatory Visit | Attending: Surgery | Admitting: Surgery

## 2020-11-21 ENCOUNTER — Encounter (HOSPITAL_COMMUNITY): Payer: Self-pay

## 2020-11-21 DIAGNOSIS — Z01818 Encounter for other preprocedural examination: Secondary | ICD-10-CM | POA: Diagnosis not present

## 2020-11-21 DIAGNOSIS — Z20822 Contact with and (suspected) exposure to covid-19: Secondary | ICD-10-CM | POA: Diagnosis not present

## 2020-11-21 HISTORY — DX: Unspecified asthma, uncomplicated: J45.909

## 2020-11-21 HISTORY — DX: Cardiac murmur, unspecified: R01.1

## 2020-11-21 HISTORY — DX: Unspecified osteoarthritis, unspecified site: M19.90

## 2020-11-21 HISTORY — DX: Pneumonia, unspecified organism: J18.9

## 2020-11-21 LAB — BASIC METABOLIC PANEL
Anion gap: 8 (ref 5–15)
BUN: 19 mg/dL (ref 8–23)
CO2: 22 mmol/L (ref 22–32)
Calcium: 10.5 mg/dL — ABNORMAL HIGH (ref 8.9–10.3)
Chloride: 110 mmol/L (ref 98–111)
Creatinine, Ser: 1.16 mg/dL (ref 0.61–1.24)
GFR, Estimated: >60 mL/min (ref >60)
Glucose, Bld: 102 mg/dL — ABNORMAL HIGH (ref 70–99)
Potassium: 4.2 mmol/L (ref 3.5–5.1)
Sodium: 140 mmol/L (ref 135–145)

## 2020-11-21 LAB — CBC
HCT: 42.3 % (ref 39.0–52.0)
Hemoglobin: 14.1 g/dL (ref 13.0–17.0)
MCH: 30.1 pg (ref 26.0–34.0)
MCHC: 33.3 g/dL (ref 30.0–36.0)
MCV: 90.2 fL (ref 80.0–100.0)
Platelets: 263 10*3/uL (ref 150–400)
RBC: 4.69 MIL/uL (ref 4.22–5.81)
RDW: 13 % (ref 11.5–15.5)
WBC: 7.1 10*3/uL (ref 4.0–10.5)
nRBC: 0 % (ref 0.0–0.2)

## 2020-11-21 LAB — SARS CORONAVIRUS 2 (TAT 6-24 HRS): SARS Coronavirus 2: NEGATIVE

## 2020-11-21 NOTE — Anesthesia Pain Management Evaluation Note (Addendum)
Anesthesia Review:  PCP: DR Gar Ponto  Requested LOV note on 11/21/20.   Cardiologist : Chest x-ray : EKG :11/21/20 Echo : Stress test: Cardiac Cath :  Activity level: can do a flight of stairs without difficulty  Sleep Study/ CPAP : no  Fasting Blood Sugar :      / Checks Blood Sugar -- times a day:   Blood Thinner/ Instructions /Last Dose: ASA / Instructions/ Last Dose :  Pt denies any chest pain, dizziness or headache at preop visit.  B/p at time of preop visit was 161/97

## 2020-11-25 ENCOUNTER — Ambulatory Visit (HOSPITAL_COMMUNITY)
Admission: RE | Admit: 2020-11-25 | Discharge: 2020-11-25 | Disposition: A | Discharge status: Home / Self Care | Payer: Medicare Other | Attending: Surgery | Admitting: Surgery

## 2020-11-25 ENCOUNTER — Other Ambulatory Visit: Payer: Self-pay

## 2020-11-25 ENCOUNTER — Encounter (HOSPITAL_COMMUNITY)
Admission: RE | Disposition: A | Discharge status: Home / Self Care | Payer: Self-pay | Source: Home / Self Care | Attending: Surgery

## 2020-11-25 ENCOUNTER — Ambulatory Visit (HOSPITAL_COMMUNITY): Payer: Medicare Other | Admitting: Physician Assistant

## 2020-11-25 ENCOUNTER — Encounter (HOSPITAL_COMMUNITY): Payer: Self-pay | Admitting: Surgery

## 2020-11-25 ENCOUNTER — Ambulatory Visit (HOSPITAL_COMMUNITY): Payer: Medicare Other | Admitting: Anesthesiology

## 2020-11-25 DIAGNOSIS — Z882 Allergy status to sulfonamides status: Secondary | ICD-10-CM | POA: Insufficient documentation

## 2020-11-25 DIAGNOSIS — Z832 Family history of diseases of the blood and blood-forming organs and certain disorders involving the immune mechanism: Secondary | ICD-10-CM | POA: Insufficient documentation

## 2020-11-25 DIAGNOSIS — E210 Primary hyperparathyroidism: Secondary | ICD-10-CM | POA: Insufficient documentation

## 2020-11-25 DIAGNOSIS — Z7951 Long term (current) use of inhaled steroids: Secondary | ICD-10-CM | POA: Insufficient documentation

## 2020-11-25 DIAGNOSIS — K219 Gastro-esophageal reflux disease without esophagitis: Secondary | ICD-10-CM | POA: Insufficient documentation

## 2020-11-25 DIAGNOSIS — Z79899 Other long term (current) drug therapy: Secondary | ICD-10-CM | POA: Diagnosis not present

## 2020-11-25 DIAGNOSIS — Z823 Family history of stroke: Secondary | ICD-10-CM | POA: Diagnosis not present

## 2020-11-25 DIAGNOSIS — Z87891 Personal history of nicotine dependence: Secondary | ICD-10-CM | POA: Insufficient documentation

## 2020-11-25 DIAGNOSIS — Z820 Family history of epilepsy and other diseases of the nervous system: Secondary | ICD-10-CM | POA: Diagnosis not present

## 2020-11-25 DIAGNOSIS — Z881 Allergy status to other antibiotic agents status: Secondary | ICD-10-CM | POA: Insufficient documentation

## 2020-11-25 DIAGNOSIS — D351 Benign neoplasm of parathyroid gland: Secondary | ICD-10-CM | POA: Diagnosis not present

## 2020-11-25 DIAGNOSIS — R3914 Feeling of incomplete bladder emptying: Secondary | ICD-10-CM | POA: Diagnosis not present

## 2020-11-25 DIAGNOSIS — J45909 Unspecified asthma, uncomplicated: Secondary | ICD-10-CM | POA: Diagnosis not present

## 2020-11-25 HISTORY — PX: PARATHYROIDECTOMY: SHX19

## 2020-11-25 SURGERY — PARATHYROIDECTOMY
Anesthesia: General | Laterality: Left

## 2020-11-25 MED ORDER — BUPIVACAINE HCL 0.25 % IJ SOLN
INTRAMUSCULAR | Status: DC | PRN
  Administered 2020-11-25: 10 mL

## 2020-11-25 MED ORDER — PROPOFOL 10 MG/ML IV BOLUS
INTRAVENOUS | Status: DC | PRN
  Administered 2020-11-25: 150 mg via INTRAVENOUS

## 2020-11-25 MED ORDER — TRAMADOL HCL 50 MG PO TABS: 50.0000 mg | ORAL_TABLET | Freq: Four times a day (QID) | ORAL | 0 refills | Status: DC | PRN

## 2020-11-25 MED ORDER — DEXAMETHASONE SODIUM PHOSPHATE 10 MG/ML IJ SOLN
INTRAMUSCULAR | Status: AC
  Filled 2020-11-25: qty 1

## 2020-11-25 MED ORDER — ROCURONIUM BROMIDE 10 MG/ML (PF) SYRINGE
PREFILLED_SYRINGE | INTRAVENOUS | Status: DC | PRN
  Administered 2020-11-25: 20 mg via INTRAVENOUS
  Administered 2020-11-25: 50 mg via INTRAVENOUS

## 2020-11-25 MED ORDER — 0.9 % SODIUM CHLORIDE (POUR BTL) OPTIME
TOPICAL | Status: DC | PRN
  Administered 2020-11-25: 1000 mL

## 2020-11-25 MED ORDER — LACTATED RINGERS IV SOLN: INTRAVENOUS | Status: DC

## 2020-11-25 MED ORDER — FENTANYL CITRATE (PF) 250 MCG/5ML IJ SOLN
INTRAMUSCULAR | Status: AC
  Filled 2020-11-25: qty 5

## 2020-11-25 MED ORDER — CHLORHEXIDINE GLUCONATE 0.12 % MT SOLN
15.0000 mL | Freq: Once | OROMUCOSAL | Status: AC
  Administered 2020-11-25: 15 mL via OROMUCOSAL

## 2020-11-25 MED ORDER — CEFAZOLIN SODIUM-DEXTROSE 2-4 GM/100ML-% IV SOLN
2.0000 g | INTRAVENOUS | Status: AC
  Administered 2020-11-25: 2 g via INTRAVENOUS
  Filled 2020-11-25: qty 100

## 2020-11-25 MED ORDER — ORAL CARE MOUTH RINSE: 15.0000 mL | Freq: Once | OROMUCOSAL | Status: AC

## 2020-11-25 MED ORDER — ACETAMINOPHEN 500 MG PO TABS
1000.0000 mg | ORAL_TABLET | Freq: Once | ORAL | Status: AC
  Administered 2020-11-25: 1000 mg via ORAL
  Filled 2020-11-25: qty 2

## 2020-11-25 MED ORDER — BUPIVACAINE HCL 0.25 % IJ SOLN
INTRAMUSCULAR | Status: AC
  Filled 2020-11-25: qty 1

## 2020-11-25 MED ORDER — ROCURONIUM BROMIDE 10 MG/ML (PF) SYRINGE
PREFILLED_SYRINGE | INTRAVENOUS | Status: AC
  Filled 2020-11-25: qty 10

## 2020-11-25 MED ORDER — CHLORHEXIDINE GLUCONATE CLOTH 2 % EX PADS: 6.0000 | MEDICATED_PAD | Freq: Once | CUTANEOUS | Status: DC

## 2020-11-25 MED ORDER — PROPOFOL 10 MG/ML IV BOLUS
INTRAVENOUS | Status: AC
  Filled 2020-11-25: qty 20

## 2020-11-25 MED ORDER — LIDOCAINE 2% (20 MG/ML) 5 ML SYRINGE
INTRAMUSCULAR | Status: AC
  Filled 2020-11-25: qty 5

## 2020-11-25 MED ORDER — DEXAMETHASONE SODIUM PHOSPHATE 10 MG/ML IJ SOLN
INTRAMUSCULAR | Status: DC | PRN
  Administered 2020-11-25: 5 mg via INTRAVENOUS

## 2020-11-25 MED ORDER — ONDANSETRON HCL 4 MG/2ML IJ SOLN
INTRAMUSCULAR | Status: DC | PRN
  Administered 2020-11-25: 4 mg via INTRAVENOUS

## 2020-11-25 MED ORDER — FENTANYL CITRATE (PF) 100 MCG/2ML IJ SOLN: 25.0000 ug | INTRAMUSCULAR | Status: DC | PRN

## 2020-11-25 MED ORDER — SUGAMMADEX SODIUM 200 MG/2ML IV SOLN
INTRAVENOUS | Status: DC | PRN
  Administered 2020-11-25: 200 mg via INTRAVENOUS

## 2020-11-25 MED ORDER — LIDOCAINE 2% (20 MG/ML) 5 ML SYRINGE
INTRAMUSCULAR | Status: DC | PRN
  Administered 2020-11-25: 60 mg via INTRAVENOUS

## 2020-11-25 MED ORDER — ONDANSETRON HCL 4 MG/2ML IJ SOLN
INTRAMUSCULAR | Status: AC
  Filled 2020-11-25: qty 2

## 2020-11-25 MED ORDER — FENTANYL CITRATE (PF) 250 MCG/5ML IJ SOLN
INTRAMUSCULAR | Status: DC | PRN
  Administered 2020-11-25: 100 ug via INTRAVENOUS
  Administered 2020-11-25: 50 ug via INTRAVENOUS

## 2020-11-25 SURGICAL SUPPLY — 33 items
ADH SKN CLS APL DERMABOND .7 (GAUZE/BANDAGES/DRESSINGS) ×1
APL PRP STRL LF DISP 70% ISPRP (MISCELLANEOUS) ×1
ATTRACTOMAT 16X20 MAGNETIC DRP (DRAPES) ×2 IMPLANT
BLADE SURG 15 STRL LF DISP TIS (BLADE) ×1 IMPLANT
BLADE SURG 15 STRL SS (BLADE) ×2
CHLORAPREP W/TINT 26 (MISCELLANEOUS) ×2 IMPLANT
CLIP VESOCCLUDE MED 6/CT (CLIP) ×4 IMPLANT
CLIP VESOCCLUDE SM WIDE 6/CT (CLIP) ×4 IMPLANT
COVER SURGICAL LIGHT HANDLE (MISCELLANEOUS) ×2 IMPLANT
COVER WAND RF STERILE (DRAPES) ×2 IMPLANT
DERMABOND ADVANCED (GAUZE/BANDAGES/DRESSINGS) ×1
DERMABOND ADVANCED .7 DNX12 (GAUZE/BANDAGES/DRESSINGS) ×1 IMPLANT
DRAPE LAPAROTOMY T 98X78 PEDS (DRAPES) ×2 IMPLANT
DRAPE UTILITY XL STRL (DRAPES) ×2 IMPLANT
ELECT REM PT RETURN 15FT ADLT (MISCELLANEOUS) ×2 IMPLANT
GAUZE 4X4 16PLY RFD (DISPOSABLE) ×2 IMPLANT
GLOVE SURG ORTHO LTX SZ8 (GLOVE) ×2 IMPLANT
GOWN STRL REUS W/TWL XL LVL3 (GOWN DISPOSABLE) ×6 IMPLANT
HEMOSTAT SURGICEL 2X4 FIBR (HEMOSTASIS) ×2 IMPLANT
ILLUMINATOR WAVEGUIDE N/F (MISCELLANEOUS) IMPLANT
KIT BASIN OR (CUSTOM PROCEDURE TRAY) ×2 IMPLANT
KIT TURNOVER KIT A (KITS) ×2 IMPLANT
NDL HYPO 25X1 1.5 SAFETY (NEEDLE) ×1 IMPLANT
NEEDLE HYPO 25X1 1.5 SAFETY (NEEDLE) ×2 IMPLANT
PACK BASIC VI WITH GOWN DISP (CUSTOM PROCEDURE TRAY) ×2 IMPLANT
PENCIL SMOKE EVACUATOR (MISCELLANEOUS) ×2 IMPLANT
SUT MNCRL AB 4-0 PS2 18 (SUTURE) ×2 IMPLANT
SUT VIC AB 3-0 SH 18 (SUTURE) ×2 IMPLANT
SYR BULB IRRIG 60ML STRL (SYRINGE) ×2 IMPLANT
SYR CONTROL 10ML LL (SYRINGE) ×2 IMPLANT
TOWEL OR 17X26 10 PK STRL BLUE (TOWEL DISPOSABLE) ×2 IMPLANT
TOWEL OR NON WOVEN STRL DISP B (DISPOSABLE) ×2 IMPLANT
TUBING CONNECTING 10 (TUBING) ×2 IMPLANT

## 2020-11-25 NOTE — Interval H&P Note (Signed)
History and Physical Interval Note:  11/25/2020 9:55 AM  Timothy Quinn  has presented today for surgery, with the diagnosis of PRIMARY HYPERPARATHYROIDISM.  The various methods of treatment have been discussed with the patient and family. After consideration of risks, benefits and other options for treatment, the patient has consented to    Procedure(s) with comments: LEFT INFERIOR PARATHYROIDECTOMY (Left) - 90 MIN as a surgical intervention.    The patient's history has been reviewed, patient examined, no change in status, stable for surgery.  I have reviewed the patient's chart and labs.  Questions were answered to the patient's satisfaction.    Armandina Gemma, MD [facility] Office: [place]

## 2020-11-25 NOTE — Discharge Instructions (Signed)
[facility]  THYROID & PARATHYROID SURGERY:  POST-OP INSTRUCTIONS  Always review your discharge instruction sheet from the facility where your surgery was performed.  A prescription for pain medication may be given to you upon discharge.  Take your pain medication as prescribed.  If narcotic pain medicine is not needed, then you may take acetaminophen (Tylenol) or ibuprofen (Advil) as needed.  Take your usually prescribed medications unless otherwise directed.  If you need a refill on your pain medication, please contact our office during regular business hours.  Prescriptions cannot be processed by our office after 5 pm or on weekends.  Start with a light diet upon arrival home, such as soup and crackers or toast.  Be sure to drink plenty of fluids daily.  Resume your normal diet the day after surgery.  Most patients will experience some swelling and bruising on the chest and neck area.  Ice packs will help.  Swelling and bruising can take several days to resolve.   It is common to experience some constipation after surgery.  Increasing fluid intake and taking a stool softener (Colace) will usually help or prevent this problem.  A mild laxative (Milk of Magnesia or Miralax) should be taken according to package directions if there has been no bowel movement after 48 hours.  You have steri-strips and a gauze dressing over your incision.  You may remove the gauze bandage on the second day after surgery, and you may shower at that time.  Leave your steri-strips (small skin tapes) in place directly over the incision.  These strips should remain on the skin for 5-7 days and then be removed.  You may get them wet in the shower and pat them dry.  You may resume regular (light) daily activities beginning the next day (such as daily self-care, walking, climbing stairs) gradually increasing activities as tolerated.  You may have sexual intercourse when it is comfortable.  Refrain from  any heavy lifting or straining until approved by your doctor.  You may drive when you no longer are taking prescription pain medication, you can comfortably wear a seatbelt, and you can safely maneuver your car and apply brakes.  You should see your doctor in the office for a follow-up appointment approximately three weeks after your surgery.  Make sure that you call for this appointment within a day or two after you arrive home to insure a convenient appointment time.  WHEN TO CALL YOUR DOCTOR: -- Fever greater than 101.5 -- Inability to urinate -- Nausea and/or vomiting - persistent -- Extreme swelling or bruising -- Continued bleeding from incision -- Increased pain, redness, or drainage from the incision -- Difficulty swallowing or breathing -- Muscle cramping or spasms -- Numbness or tingling in hands or around lips  The clinic staff is available to answer your questions during regular business hours.  Please don't hesitate to call and ask to speak to one of the nurses if you have concerns.  Thursa Emme, MD [facility] Office: [phone] 

## 2020-11-25 NOTE — Transfer of Care (Signed)
Immediate Anesthesia Transfer of Care Note  Patient: Timothy Quinn  Procedure(s) Performed: LEFT INFERIOR PARATHYROIDECTOMY (Left )  Patient Location: PACU  Anesthesia Type:General  Level of Consciousness: awake, drowsy and responds to stimulation  Airway & Oxygen Therapy: Patient Spontanous Breathing and Patient connected to face mask oxygen  Post-op Assessment: Report given to RN and Post -op Vital signs reviewed and stable  Post vital signs: Reviewed and stable  Last Vitals:  Vitals Value Taken Time  BP 136/91 11/25/20 1107  Temp    Pulse 67 11/25/20 1108  Resp    SpO2 100 % 11/25/20 1108  Vitals shown include unvalidated device data.  Last Pain:  Vitals:   11/25/20 0855  TempSrc: Oral  PainSc: 0-No pain         Complications: No complications documented.

## 2020-11-25 NOTE — Anesthesia Preprocedure Evaluation (Addendum)
Anesthesia Evaluation  Patient identified by MRN, date of birth, ID band Patient awake    Reviewed: Allergy & Precautions, H&P , NPO status , Patient's Chart, lab work & pertinent test results  Airway Mallampati: II  TM Distance: >3 FB Neck ROM: Full    Dental no notable dental hx. (+) Edentulous Upper, Edentulous Lower, Dental Advisory Given   Pulmonary asthma ,    Pulmonary exam normal breath sounds clear to auscultation       Cardiovascular negative cardio ROS   Rhythm:Regular Rate:Normal     Neuro/Psych negative neurological ROS  negative psych ROS   GI/Hepatic negative GI ROS, Neg liver ROS,   Endo/Other  negative endocrine ROS  Renal/GU negative Renal ROS  negative genitourinary   Musculoskeletal  (+) Arthritis , Osteoarthritis,    Abdominal   Peds  Hematology negative hematology ROS (+)   Anesthesia Other Findings   Reproductive/Obstetrics negative OB ROS                            Anesthesia Physical Anesthesia Plan  ASA: II  Anesthesia Plan: General   Post-op Pain Management:    Induction: Intravenous  PONV Risk Score and Plan: 3 and Ondansetron, Dexamethasone and Treatment may vary due to age or medical condition  Airway Management Planned: Oral ETT  Additional Equipment:   Intra-op Plan:   Post-operative Plan: Extubation in OR  Informed Consent: I have reviewed the patients History and Physical, chart, labs and discussed the procedure including the risks, benefits and alternatives for the proposed anesthesia with the patient or authorized representative who has indicated his/her understanding and acceptance.     Dental advisory given  Plan Discussed with: CRNA  Anesthesia Plan Comments:         Anesthesia Quick Evaluation

## 2020-11-25 NOTE — Op Note (Signed)
OPERATIVE REPORT - PARATHYROIDECTOMY  Preoperative diagnosis: Primary hyperparathyroidism  Postop diagnosis: Same  Procedure: Left superior minimally invasive parathyroidectomy  Surgeon:  Armandina Gemma, MD  Anesthesia: General endotracheal  Estimated blood loss: Minimal  Preparation: ChloraPrep  Indications: Patient is referred by Dr. Gar Ponto for surgical evaluation and management of primary hyperparathyroidism. Patient was noted on routine laboratory studies to have an elevated serum calcium level of 10.9. Subsequent intact PTH level was noted to be elevated at 72. Patient underwent nuclear medicine parathyroid scan on September 11, 2020. This localized a left inferior parathyroid adenoma. Patient has experienced fatigue. He is having headaches. He denies nephrolithiasis. He has had no prior head or neck surgery. There is no family history of parathyroid disease. Patient is referred at this time for evaluation for minimally invasive parathyroidectomy.   Procedure: The patient was prepared in the pre-operative holding area. The patient was brought to the operating room and placed in a supine position on the operating room table. Following administration of general anesthesia, the patient was positioned and then prepped and draped in the usual strict aseptic fashion. After ascertaining that an adequate level of anesthesia been achieved, a neck incision was made with a #15 blade. Dissection was carried through subcutaneous tissues and platysma. Hemostasis was obtained with the electrocautery. Skin flaps were developed circumferentially and a Weitlander retractor was placed for exposure.  Strap muscles were incised in the midline. Strap muscles were reflected laterally exposing the thyroid lobe. With gentle blunt dissection the thyroid lobe was mobilized.  Dissection was carried through adipose tissue and an enlarged parathyroid gland was identified very posteriorly just above the  inferior thyroid artery. It was gently mobilized. Vascular structures were divided between medium ligaclips at the superior aspect of the gland. Care was taken to avoid the recurrent laryngeal nerve and the esophagus. The parathyroid gland was completely excised. It was submitted to pathology where frozen section by Dr. Claudette Laws demonstrated likely parathyroid tissue with a very follicular architecture most consistent with adenoma.  Additional stains will be performed to confirm parathyroid tissue.  Neck was irrigated with warm saline and good hemostasis was noted. Fibrillar was placed in the operative field. Strap muscles were approximated in the midline with interrupted 3-0 Vicryl sutures. Platysma was closed with interrupted 3-0 Vicryl sutures. Marcaine was infiltrated circumferentially. Skin was closed with a running 4-0 Monocryl subcuticular suture. Wound was washed and dried and Dermabond was applied. Patient was awakened from anesthesia and brought to the recovery room. The patient tolerated the procedure well.   Armandina Gemma, MD [facility] Office: [phone]

## 2020-11-25 NOTE — Anesthesia Procedure Notes (Signed)
Procedure Name: Intubation Date/Time: 11/25/2020 10:09 AM Performed by: Lollie Sails, CRNA Pre-anesthesia Checklist: Patient identified, Emergency Drugs available, Suction available, Patient being monitored and Timeout performed Patient Re-evaluated:Patient Re-evaluated prior to induction Oxygen Delivery Method: Circle system utilized Preoxygenation: Pre-oxygenation with 100% oxygen Induction Type: IV induction Ventilation: Mask ventilation without difficulty Laryngoscope Size: Miller and 3 Grade View: Grade I Tube type: Oral Tube size: 7.5 mm Number of attempts: 1 Airway Equipment and Method: Stylet Placement Confirmation: ETT inserted through vocal cords under direct vision,  positive ETCO2 and breath sounds checked- equal and bilateral Secured at: 23 cm Tube secured with: Tape Dental Injury: Teeth and Oropharynx as per pre-operative assessment

## 2020-11-25 NOTE — Anesthesia Postprocedure Evaluation (Signed)
Anesthesia Post Note  Patient: Timothy Quinn  Procedure(s) Performed: LEFT INFERIOR PARATHYROIDECTOMY (Left )     Patient location during evaluation: PACU Anesthesia Type: General Level of consciousness: awake and alert Pain management: pain level controlled Vital Signs Assessment: post-procedure vital signs reviewed and stable Respiratory status: spontaneous breathing, nonlabored ventilation and respiratory function stable Cardiovascular status: blood pressure returned to baseline and stable Postop Assessment: no apparent nausea or vomiting Anesthetic complications: no   No complications documented.  Last Vitals:  Vitals:   11/25/20 1130 11/25/20 1135  BP: 129/90 (!) 138/92  Pulse: 65   Resp: 15 16  Temp:  37 C  SpO2: 93%     Last Pain:  Vitals:   11/25/20 1135  TempSrc:   PainSc: 0-No pain                 Natalyn Szymanowski,W. EDMOND

## 2020-11-26 ENCOUNTER — Encounter (HOSPITAL_COMMUNITY): Payer: Self-pay | Admitting: Surgery

## 2020-11-26 DIAGNOSIS — N183 Chronic kidney disease, stage 3 unspecified: Secondary | ICD-10-CM | POA: Diagnosis not present

## 2020-11-26 DIAGNOSIS — I129 Hypertensive chronic kidney disease with stage 1 through stage 4 chronic kidney disease, or unspecified chronic kidney disease: Secondary | ICD-10-CM | POA: Diagnosis not present

## 2020-11-26 DIAGNOSIS — D509 Iron deficiency anemia, unspecified: Secondary | ICD-10-CM | POA: Diagnosis not present

## 2020-11-26 DIAGNOSIS — E7849 Other hyperlipidemia: Secondary | ICD-10-CM | POA: Diagnosis not present

## 2020-11-26 LAB — SURGICAL PATHOLOGY

## 2020-12-08 DIAGNOSIS — E210 Primary hyperparathyroidism: Secondary | ICD-10-CM | POA: Diagnosis not present

## 2020-12-26 DIAGNOSIS — H2513 Age-related nuclear cataract, bilateral: Secondary | ICD-10-CM | POA: Diagnosis not present

## 2020-12-26 DIAGNOSIS — H40033 Anatomical narrow angle, bilateral: Secondary | ICD-10-CM | POA: Diagnosis not present

## 2021-01-26 DIAGNOSIS — N183 Chronic kidney disease, stage 3 unspecified: Secondary | ICD-10-CM | POA: Diagnosis not present

## 2021-01-26 DIAGNOSIS — E7849 Other hyperlipidemia: Secondary | ICD-10-CM | POA: Diagnosis not present

## 2021-01-26 DIAGNOSIS — I129 Hypertensive chronic kidney disease with stage 1 through stage 4 chronic kidney disease, or unspecified chronic kidney disease: Secondary | ICD-10-CM | POA: Diagnosis not present

## 2021-01-26 DIAGNOSIS — D509 Iron deficiency anemia, unspecified: Secondary | ICD-10-CM | POA: Diagnosis not present

## 2021-02-05 DIAGNOSIS — D529 Folate deficiency anemia, unspecified: Secondary | ICD-10-CM | POA: Diagnosis not present

## 2021-02-05 DIAGNOSIS — N183 Chronic kidney disease, stage 3 unspecified: Secondary | ICD-10-CM | POA: Diagnosis not present

## 2021-02-05 DIAGNOSIS — D519 Vitamin B12 deficiency anemia, unspecified: Secondary | ICD-10-CM | POA: Diagnosis not present

## 2021-02-05 DIAGNOSIS — D649 Anemia, unspecified: Secondary | ICD-10-CM | POA: Diagnosis not present

## 2021-02-05 DIAGNOSIS — E782 Mixed hyperlipidemia: Secondary | ICD-10-CM | POA: Diagnosis not present

## 2021-02-05 DIAGNOSIS — I10 Essential (primary) hypertension: Secondary | ICD-10-CM | POA: Diagnosis not present

## 2021-02-05 DIAGNOSIS — E213 Hyperparathyroidism, unspecified: Secondary | ICD-10-CM | POA: Diagnosis not present

## 2021-02-05 DIAGNOSIS — E7849 Other hyperlipidemia: Secondary | ICD-10-CM | POA: Diagnosis not present

## 2021-02-10 DIAGNOSIS — E7849 Other hyperlipidemia: Secondary | ICD-10-CM | POA: Diagnosis not present

## 2021-02-10 DIAGNOSIS — Z0001 Encounter for general adult medical examination with abnormal findings: Secondary | ICD-10-CM | POA: Diagnosis not present

## 2021-02-10 DIAGNOSIS — J4540 Moderate persistent asthma, uncomplicated: Secondary | ICD-10-CM | POA: Diagnosis not present

## 2021-02-10 DIAGNOSIS — I10 Essential (primary) hypertension: Secondary | ICD-10-CM | POA: Diagnosis not present

## 2021-02-10 DIAGNOSIS — N1831 Chronic kidney disease, stage 3a: Secondary | ICD-10-CM | POA: Diagnosis not present

## 2021-02-10 DIAGNOSIS — D509 Iron deficiency anemia, unspecified: Secondary | ICD-10-CM | POA: Diagnosis not present

## 2021-02-26 DIAGNOSIS — N183 Chronic kidney disease, stage 3 unspecified: Secondary | ICD-10-CM | POA: Diagnosis not present

## 2021-02-26 DIAGNOSIS — I129 Hypertensive chronic kidney disease with stage 1 through stage 4 chronic kidney disease, or unspecified chronic kidney disease: Secondary | ICD-10-CM | POA: Diagnosis not present

## 2021-02-26 DIAGNOSIS — E7849 Other hyperlipidemia: Secondary | ICD-10-CM | POA: Diagnosis not present

## 2021-03-29 DIAGNOSIS — N183 Chronic kidney disease, stage 3 unspecified: Secondary | ICD-10-CM | POA: Diagnosis not present

## 2021-03-29 DIAGNOSIS — E7849 Other hyperlipidemia: Secondary | ICD-10-CM | POA: Diagnosis not present

## 2021-03-29 DIAGNOSIS — D509 Iron deficiency anemia, unspecified: Secondary | ICD-10-CM | POA: Diagnosis not present

## 2021-03-29 DIAGNOSIS — I129 Hypertensive chronic kidney disease with stage 1 through stage 4 chronic kidney disease, or unspecified chronic kidney disease: Secondary | ICD-10-CM | POA: Diagnosis not present

## 2021-04-02 DIAGNOSIS — Z23 Encounter for immunization: Secondary | ICD-10-CM | POA: Diagnosis not present

## 2021-04-29 DIAGNOSIS — E7849 Other hyperlipidemia: Secondary | ICD-10-CM | POA: Diagnosis not present

## 2021-04-29 DIAGNOSIS — D509 Iron deficiency anemia, unspecified: Secondary | ICD-10-CM | POA: Diagnosis not present

## 2021-04-29 DIAGNOSIS — N183 Chronic kidney disease, stage 3 unspecified: Secondary | ICD-10-CM | POA: Diagnosis not present

## 2021-04-29 DIAGNOSIS — I129 Hypertensive chronic kidney disease with stage 1 through stage 4 chronic kidney disease, or unspecified chronic kidney disease: Secondary | ICD-10-CM | POA: Diagnosis not present

## 2021-06-09 DIAGNOSIS — J4540 Moderate persistent asthma, uncomplicated: Secondary | ICD-10-CM | POA: Diagnosis not present

## 2021-06-09 DIAGNOSIS — N1831 Chronic kidney disease, stage 3a: Secondary | ICD-10-CM | POA: Diagnosis not present

## 2021-06-09 DIAGNOSIS — I10 Essential (primary) hypertension: Secondary | ICD-10-CM | POA: Diagnosis not present

## 2021-06-09 DIAGNOSIS — Z7189 Other specified counseling: Secondary | ICD-10-CM | POA: Diagnosis not present

## 2021-06-09 DIAGNOSIS — Z23 Encounter for immunization: Secondary | ICD-10-CM | POA: Diagnosis not present

## 2021-06-09 DIAGNOSIS — D509 Iron deficiency anemia, unspecified: Secondary | ICD-10-CM | POA: Diagnosis not present

## 2021-06-09 DIAGNOSIS — E7849 Other hyperlipidemia: Secondary | ICD-10-CM | POA: Diagnosis not present

## 2021-06-21 ENCOUNTER — Other Ambulatory Visit: Payer: Self-pay | Admitting: Urology

## 2021-06-26 ENCOUNTER — Other Ambulatory Visit: Payer: Self-pay

## 2021-06-26 MED ORDER — TAMSULOSIN HCL 0.4 MG PO CAPS: 0.4000 mg | ORAL_CAPSULE | Freq: Every day | ORAL | 3 refills | Status: DC

## 2021-06-26 NOTE — Progress Notes (Signed)
Daughter called and asked for medication refill. Refilled medication and scheduled overdue appt and mailed to patient.

## 2021-06-29 DIAGNOSIS — D509 Iron deficiency anemia, unspecified: Secondary | ICD-10-CM | POA: Diagnosis not present

## 2021-06-29 DIAGNOSIS — I129 Hypertensive chronic kidney disease with stage 1 through stage 4 chronic kidney disease, or unspecified chronic kidney disease: Secondary | ICD-10-CM | POA: Diagnosis not present

## 2021-06-29 DIAGNOSIS — N183 Chronic kidney disease, stage 3 unspecified: Secondary | ICD-10-CM | POA: Diagnosis not present

## 2021-06-29 DIAGNOSIS — E7849 Other hyperlipidemia: Secondary | ICD-10-CM | POA: Diagnosis not present

## 2021-07-27 DIAGNOSIS — H40033 Anatomical narrow angle, bilateral: Secondary | ICD-10-CM | POA: Diagnosis not present

## 2021-07-27 DIAGNOSIS — H2513 Age-related nuclear cataract, bilateral: Secondary | ICD-10-CM | POA: Diagnosis not present

## 2021-07-29 DIAGNOSIS — N183 Chronic kidney disease, stage 3 unspecified: Secondary | ICD-10-CM | POA: Diagnosis not present

## 2021-07-29 DIAGNOSIS — E7849 Other hyperlipidemia: Secondary | ICD-10-CM | POA: Diagnosis not present

## 2021-07-29 DIAGNOSIS — D509 Iron deficiency anemia, unspecified: Secondary | ICD-10-CM | POA: Diagnosis not present

## 2021-07-29 DIAGNOSIS — I129 Hypertensive chronic kidney disease with stage 1 through stage 4 chronic kidney disease, or unspecified chronic kidney disease: Secondary | ICD-10-CM | POA: Diagnosis not present

## 2021-08-28 ENCOUNTER — Ambulatory Visit (INDEPENDENT_AMBULATORY_CARE_PROVIDER_SITE_OTHER): Payer: Medicare Other | Admitting: Urology

## 2021-08-28 ENCOUNTER — Encounter: Payer: Self-pay | Admitting: Urology

## 2021-08-28 ENCOUNTER — Other Ambulatory Visit: Payer: Self-pay

## 2021-08-28 VITALS — BP 156/93 | HR 76 | Wt 200.0 lb

## 2021-08-28 DIAGNOSIS — D509 Iron deficiency anemia, unspecified: Secondary | ICD-10-CM | POA: Diagnosis not present

## 2021-08-28 DIAGNOSIS — R3914 Feeling of incomplete bladder emptying: Secondary | ICD-10-CM | POA: Diagnosis not present

## 2021-08-28 DIAGNOSIS — N401 Enlarged prostate with lower urinary tract symptoms: Secondary | ICD-10-CM

## 2021-08-28 DIAGNOSIS — N5201 Erectile dysfunction due to arterial insufficiency: Secondary | ICD-10-CM

## 2021-08-28 DIAGNOSIS — E7849 Other hyperlipidemia: Secondary | ICD-10-CM | POA: Diagnosis not present

## 2021-08-28 DIAGNOSIS — I129 Hypertensive chronic kidney disease with stage 1 through stage 4 chronic kidney disease, or unspecified chronic kidney disease: Secondary | ICD-10-CM | POA: Diagnosis not present

## 2021-08-28 DIAGNOSIS — N183 Chronic kidney disease, stage 3 unspecified: Secondary | ICD-10-CM | POA: Diagnosis not present

## 2021-08-28 LAB — BLADDER SCAN AMB NON-IMAGING: Scan Result: 4

## 2021-08-28 MED ORDER — TADALAFIL 20 MG PO TABS: 20.0000 mg | ORAL_TABLET | Freq: Every day | ORAL | 1 refills | Status: AC | PRN

## 2021-08-28 MED ORDER — TAMSULOSIN HCL 0.4 MG PO CAPS
0.4000 mg | ORAL_CAPSULE | Freq: Every day | ORAL | 3 refills | Status: AC
Start: 2021-08-28 — End: ?

## 2021-08-28 NOTE — Progress Notes (Signed)
Urological Symptom Review PVR 4  Patient is experiencing the following symptoms: Frequent urination Get up at night to urinate   Review of Systems  Gastrointestinal (upper)  : Negative for upper GI symptoms  Gastrointestinal (lower) : Negative for lower GI symptoms  Constitutional : Negative for symptoms  Skin: Negative for skin symptoms  Eyes: Negative for eye symptoms  Ear/Nose/Throat : Negative for Ear/Nose/Throat symptoms  Hematologic/Lymphatic: Negative for Hematologic/Lymphatic symptoms  Cardiovascular : Negative for cardiovascular symptoms  Respiratory : Negative for respiratory symptoms  Endocrine: Negative for endocrine symptoms  Musculoskeletal: Negative for musculoskeletal symptoms  Neurological: Negative for neurological symptoms  Psychologic: Negative for psychiatric symptoms

## 2021-08-28 NOTE — Patient Instructions (Signed)

## 2021-08-28 NOTE — Progress Notes (Signed)
08/28/2021 11:05 AM   Michail Sermon 05-Oct-1931 [id number]  Referring provider: Caryl Bis, MD [place],  [place] [address]  Followup BPH and erectile dysfunction   HPI: Timothy Quinn is a 87yo here for followup for BPH and erectile dysfunction. PVR 4cc. IPSS 14 QOL1. Nocturia 2x. He is on flomax 0.4mg  daily. Urine stream strong. No straining to urinate. No urinary hesitancy. He uses tadalafil 20mg  prn with good results. NO other complaints today.    PMH: Past Medical History:  Diagnosis Date   Arthritis    Asthma    Heart murmur    Pneumonia    hx of x 2     Surgical History: Past Surgical History:  Procedure Laterality Date   broken right hip surgery      CIRCUMCISION     PARATHYROIDECTOMY Left 11/25/2020   Procedure: LEFT INFERIOR PARATHYROIDECTOMY;  Surgeon: Armandina Gemma, MD;  Location: [facility];  Service: General;  Laterality: Left;  90 MIN    Home Medications:  Allergies as of 08/28/2021       Reactions   Ciprofloxacin Hives   Sulfa Antibiotics Hives        Medication List        Accurate as of August 28, 2021 11:05 AM. If you have any questions, ask your nurse or doctor.          STOP taking these medications    lisinopril-hydrochlorothiazide 10-12.5 MG tablet Commonly known as: ZESTORETIC Stopped by: Nicolette Bang, MD   tadalafil 20 MG tablet Commonly known as: CIALIS Stopped by: Nicolette Bang, MD   traMADol 50 MG tablet Commonly known as: ULTRAM Stopped by: Nicolette Bang, MD       TAKE these medications    Advair Diskus 250-50 MCG/ACT Aepb Generic drug: fluticasone-salmeterol Inhale 1 puff into the lungs daily.   aspirin EC 81 MG tablet Take 81 mg by mouth daily. Swallow whole.   ferrous sulfate 325 (65 FE) MG tablet Take 325 mg by mouth daily with breakfast.   fexofenadine 180 MG tablet Commonly known as: ALLEGRA Take 180 mg by mouth daily.   omeprazole 20 MG capsule Commonly known as: PRILOSEC Take  20 mg by mouth daily.   ProAir HFA 108 (90 Base) MCG/ACT inhaler Generic drug: albuterol Inhale 2 puffs into the lungs every 4 (four) hours as needed for wheezing or shortness of breath.   tamsulosin 0.4 MG Caps capsule Commonly known as: FLOMAX Take 1 capsule (0.4 mg total) by mouth daily. What changed: Another medication with the same name was removed. Continue taking this medication, and follow the directions you see here. Changed by: Nicolette Bang, MD   vitamin B-12 500 MCG tablet Commonly known as: CYANOCOBALAMIN Take 500 mcg by mouth daily.   vitamin E 180 MG (400 UNITS) capsule Generic drug: vitamin E Take 400 Units by mouth daily.        Allergies:  Allergies  Allergen Reactions   Ciprofloxacin Hives   Sulfa Antibiotics Hives    Family History: No family history on file.  Social History:  reports that he has never smoked. He has never used smokeless tobacco. He reports that he does not drink alcohol and does not use drugs.  ROS: All other review of systems were reviewed and are negative except what is noted above in HPI  Physical Exam: BP (!) 156/93    Pulse 76    Wt 200 lb (90.7 kg)    BMI 25.00 kg/m  Constitutional:  Alert and oriented, No acute distress. HEENT: [place] AT, moist mucus membranes.  Trachea midline, no masses. Cardiovascular: No clubbing, cyanosis, or edema. Respiratory: Normal respiratory effort, no increased work of breathing. GI: Abdomen is soft, nontender, nondistended, no abdominal masses GU: No CVA tenderness.  Lymph: No cervical or inguinal lymphadenopathy. Skin: No rashes, bruises or suspicious lesions. Neurologic: Grossly intact, no focal deficits, moving all 4 extremities. Psychiatric: Normal mood and affect.  Laboratory Data: Lab Results  Component Value Date   WBC 7.1 11/21/2020   HGB 14.1 11/21/2020   HCT 42.3 11/21/2020   MCV 90.2 11/21/2020   PLT 263 11/21/2020    Lab Results  Component Value Date   CREATININE  1.16 11/21/2020    No results found for: PSA  No results found for: TESTOSTERONE  No results found for: HGBA1C  Urinalysis    Component Value Date/Time   APPEARANCEUR Clear 05/12/2020 1513   GLUCOSEU Negative 05/12/2020 1513   BILIRUBINUR Negative 05/12/2020 1513   PROTEINUR Negative 05/12/2020 1513   NITRITE Negative 05/12/2020 1513   LEUKOCYTESUR Negative 05/12/2020 1513    Lab Results  Component Value Date   LABMICR Comment 05/12/2020    Pertinent Imaging:  No results found for this or any previous visit.  No results found for this or any previous visit.  No results found for this or any previous visit.  No results found for this or any previous visit.  No results found for this or any previous visit.  No results found for this or any previous visit.  No results found for this or any previous visit.  No results found for this or any previous visit.   Assessment & Plan:    1. Benign localized prostatic hyperplasia with lower urinary tract symptoms (LUTS) Continue flomax 0.4mg  daily - Urinalysis, Routine w reflex microscopic - BLADDER SCAN AMB NON-IMAGING  2. Erectile dysfunction due to arterial insufficiency -Tadalafil 20mg  prn  3. Feeling of incomplete bladder emptying -continue flomax 0.4mg  daily.    No follow-ups on file.  Nicolette Bang, MD  [facility] [place]

## 2021-09-27 DIAGNOSIS — E7849 Other hyperlipidemia: Secondary | ICD-10-CM | POA: Diagnosis not present

## 2021-09-27 DIAGNOSIS — I129 Hypertensive chronic kidney disease with stage 1 through stage 4 chronic kidney disease, or unspecified chronic kidney disease: Secondary | ICD-10-CM | POA: Diagnosis not present

## 2021-09-27 DIAGNOSIS — N183 Chronic kidney disease, stage 3 unspecified: Secondary | ICD-10-CM | POA: Diagnosis not present

## 2021-10-13 DIAGNOSIS — I10 Essential (primary) hypertension: Secondary | ICD-10-CM | POA: Diagnosis not present

## 2021-10-13 DIAGNOSIS — N183 Chronic kidney disease, stage 3 unspecified: Secondary | ICD-10-CM | POA: Diagnosis not present

## 2021-10-13 DIAGNOSIS — E7849 Other hyperlipidemia: Secondary | ICD-10-CM | POA: Diagnosis not present

## 2021-10-13 DIAGNOSIS — E782 Mixed hyperlipidemia: Secondary | ICD-10-CM | POA: Diagnosis not present

## 2021-10-16 DIAGNOSIS — I10 Essential (primary) hypertension: Secondary | ICD-10-CM | POA: Diagnosis not present

## 2021-10-16 DIAGNOSIS — E7849 Other hyperlipidemia: Secondary | ICD-10-CM | POA: Diagnosis not present

## 2021-10-16 DIAGNOSIS — Z23 Encounter for immunization: Secondary | ICD-10-CM | POA: Diagnosis not present

## 2021-10-16 DIAGNOSIS — D509 Iron deficiency anemia, unspecified: Secondary | ICD-10-CM | POA: Diagnosis not present

## 2021-10-16 DIAGNOSIS — D692 Other nonthrombocytopenic purpura: Secondary | ICD-10-CM | POA: Diagnosis not present

## 2021-10-16 DIAGNOSIS — J4540 Moderate persistent asthma, uncomplicated: Secondary | ICD-10-CM | POA: Diagnosis not present

## 2021-10-16 DIAGNOSIS — N1831 Chronic kidney disease, stage 3a: Secondary | ICD-10-CM | POA: Diagnosis not present

## 2021-10-27 DIAGNOSIS — N183 Chronic kidney disease, stage 3 unspecified: Secondary | ICD-10-CM | POA: Diagnosis not present

## 2021-10-27 DIAGNOSIS — I129 Hypertensive chronic kidney disease with stage 1 through stage 4 chronic kidney disease, or unspecified chronic kidney disease: Secondary | ICD-10-CM | POA: Diagnosis not present

## 2021-11-27 DIAGNOSIS — E7849 Other hyperlipidemia: Secondary | ICD-10-CM | POA: Diagnosis not present

## 2021-11-27 DIAGNOSIS — I129 Hypertensive chronic kidney disease with stage 1 through stage 4 chronic kidney disease, or unspecified chronic kidney disease: Secondary | ICD-10-CM | POA: Diagnosis not present

## 2021-12-27 DIAGNOSIS — D509 Iron deficiency anemia, unspecified: Secondary | ICD-10-CM | POA: Diagnosis not present

## 2021-12-27 DIAGNOSIS — I129 Hypertensive chronic kidney disease with stage 1 through stage 4 chronic kidney disease, or unspecified chronic kidney disease: Secondary | ICD-10-CM | POA: Diagnosis not present

## 2021-12-27 DIAGNOSIS — N183 Chronic kidney disease, stage 3 unspecified: Secondary | ICD-10-CM | POA: Diagnosis not present

## 2021-12-27 DIAGNOSIS — E7849 Other hyperlipidemia: Secondary | ICD-10-CM | POA: Diagnosis not present

## 2022-01-27 DIAGNOSIS — I129 Hypertensive chronic kidney disease with stage 1 through stage 4 chronic kidney disease, or unspecified chronic kidney disease: Secondary | ICD-10-CM | POA: Diagnosis not present

## 2022-01-27 DIAGNOSIS — N183 Chronic kidney disease, stage 3 unspecified: Secondary | ICD-10-CM | POA: Diagnosis not present

## 2022-01-27 DIAGNOSIS — E7849 Other hyperlipidemia: Secondary | ICD-10-CM | POA: Diagnosis not present

## 2022-01-27 DIAGNOSIS — D509 Iron deficiency anemia, unspecified: Secondary | ICD-10-CM | POA: Diagnosis not present

## 2022-02-23 DIAGNOSIS — D519 Vitamin B12 deficiency anemia, unspecified: Secondary | ICD-10-CM | POA: Diagnosis not present

## 2022-02-23 DIAGNOSIS — N1831 Chronic kidney disease, stage 3a: Secondary | ICD-10-CM | POA: Diagnosis not present

## 2022-02-23 DIAGNOSIS — E782 Mixed hyperlipidemia: Secondary | ICD-10-CM | POA: Diagnosis not present

## 2022-02-23 DIAGNOSIS — K219 Gastro-esophageal reflux disease without esophagitis: Secondary | ICD-10-CM | POA: Diagnosis not present

## 2022-02-23 DIAGNOSIS — E7849 Other hyperlipidemia: Secondary | ICD-10-CM | POA: Diagnosis not present

## 2022-02-23 DIAGNOSIS — I10 Essential (primary) hypertension: Secondary | ICD-10-CM | POA: Diagnosis not present

## 2022-02-23 DIAGNOSIS — D649 Anemia, unspecified: Secondary | ICD-10-CM | POA: Diagnosis not present

## 2022-02-23 DIAGNOSIS — E213 Hyperparathyroidism, unspecified: Secondary | ICD-10-CM | POA: Diagnosis not present

## 2022-02-26 DIAGNOSIS — J301 Allergic rhinitis due to pollen: Secondary | ICD-10-CM | POA: Diagnosis not present

## 2022-02-26 DIAGNOSIS — E7849 Other hyperlipidemia: Secondary | ICD-10-CM | POA: Diagnosis not present

## 2022-02-26 DIAGNOSIS — I10 Essential (primary) hypertension: Secondary | ICD-10-CM | POA: Diagnosis not present

## 2022-02-26 DIAGNOSIS — D509 Iron deficiency anemia, unspecified: Secondary | ICD-10-CM | POA: Diagnosis not present

## 2022-02-26 DIAGNOSIS — Z0001 Encounter for general adult medical examination with abnormal findings: Secondary | ICD-10-CM | POA: Diagnosis not present

## 2022-02-26 DIAGNOSIS — D692 Other nonthrombocytopenic purpura: Secondary | ICD-10-CM | POA: Diagnosis not present

## 2022-02-26 DIAGNOSIS — N1831 Chronic kidney disease, stage 3a: Secondary | ICD-10-CM | POA: Diagnosis not present

## 2022-02-26 DIAGNOSIS — J4540 Moderate persistent asthma, uncomplicated: Secondary | ICD-10-CM | POA: Diagnosis not present

## 2022-05-06 IMAGING — US US THYROID
1 series · 14 of 25 positions shown · non-contrast
Comparison: None.

CLINICAL DATA: Other.  Primary hyperparathyroidism.

EXAM:
THYROID ULTRASOUND
TECHNIQUE: Ultrasound examination of the thyroid gland and adjacent soft
tissues was performed.

[Series 1: us thyroid · 14 of 32 slices shown]
[im 1/32]
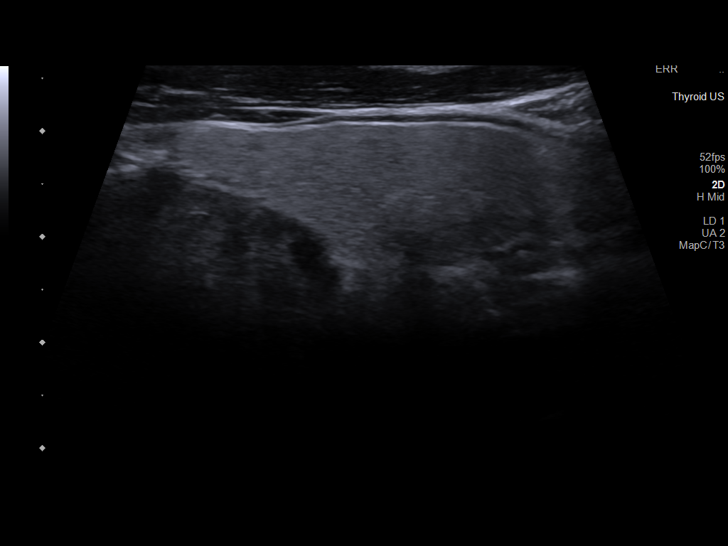
[im 3/32]
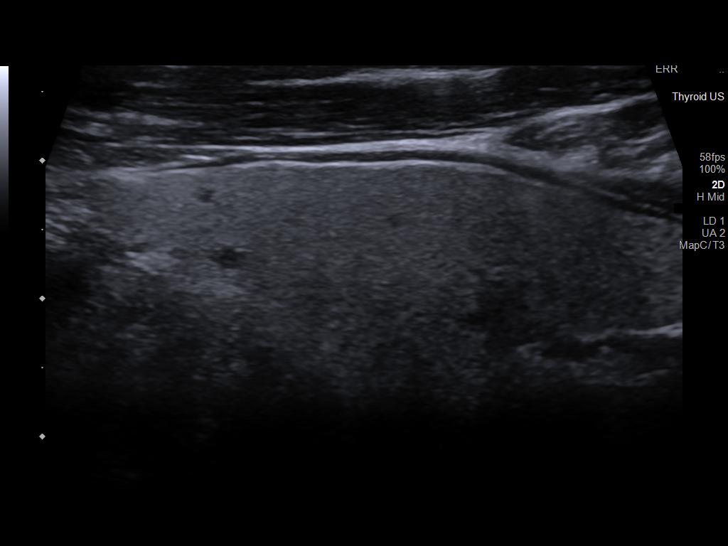
[im 6/32]
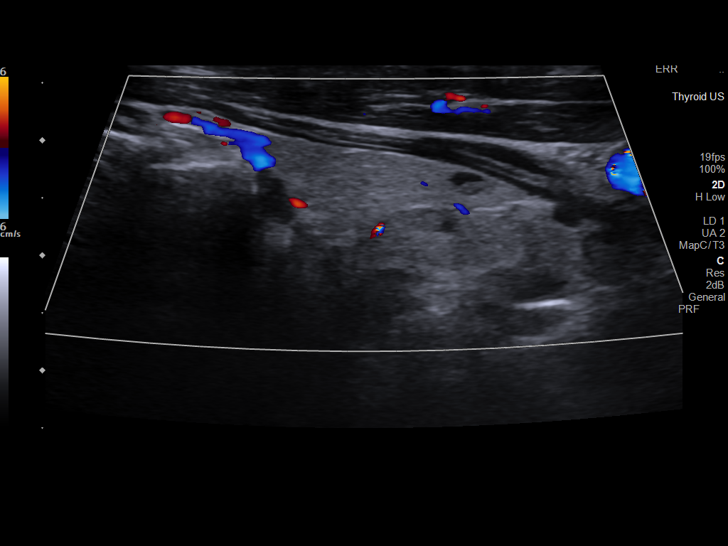
[im 8/32]
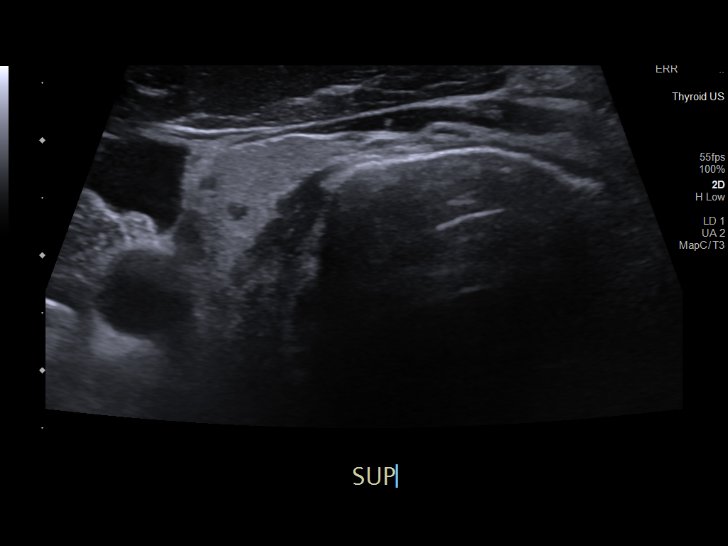
[im 11/32]
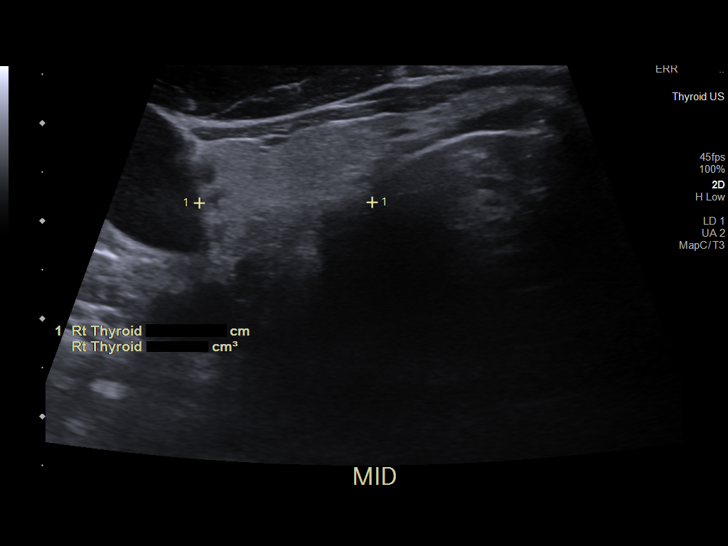
[im 12/32]
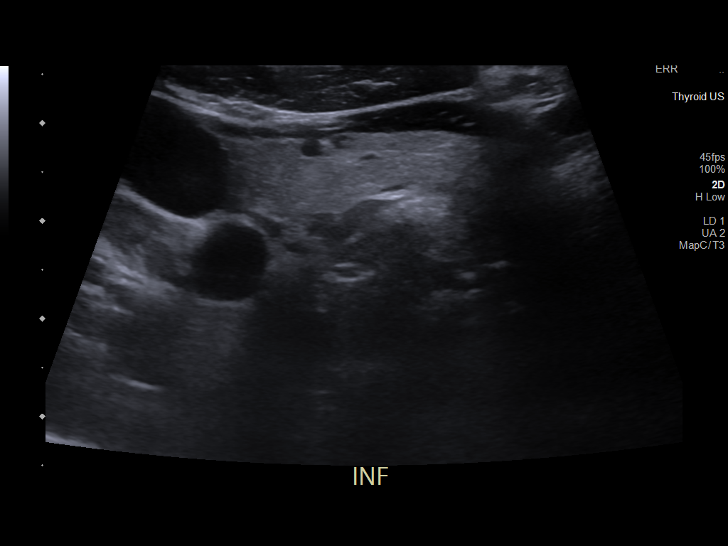
[im 15/32]
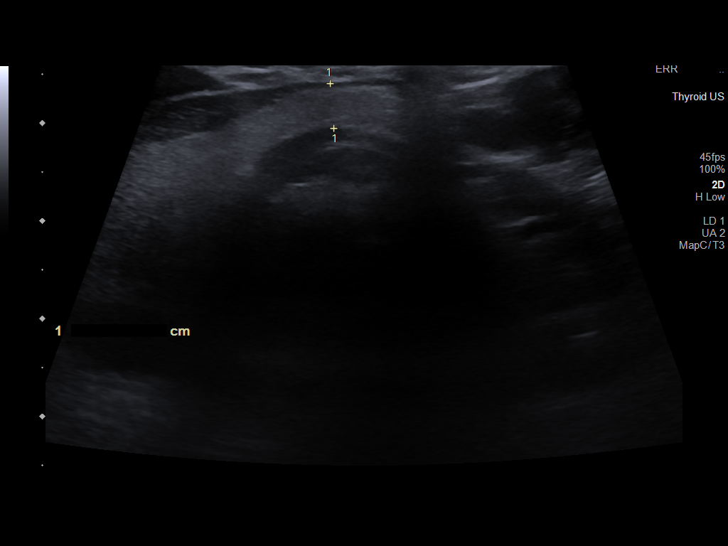
[im 17/32]
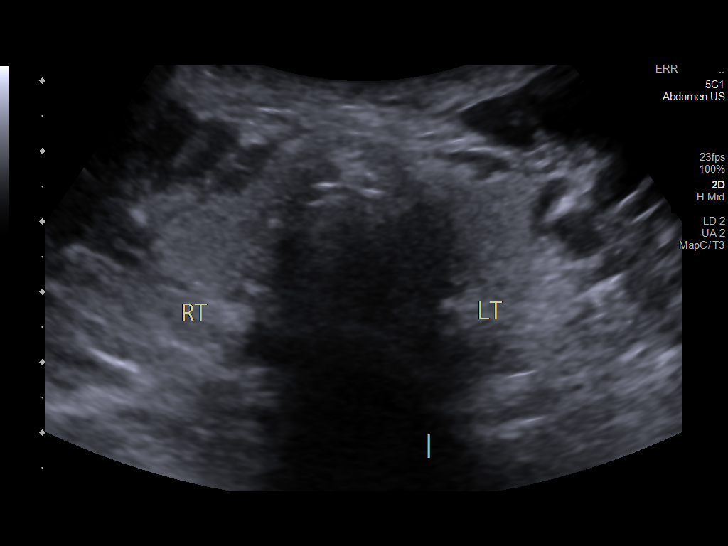
[im 20/32]
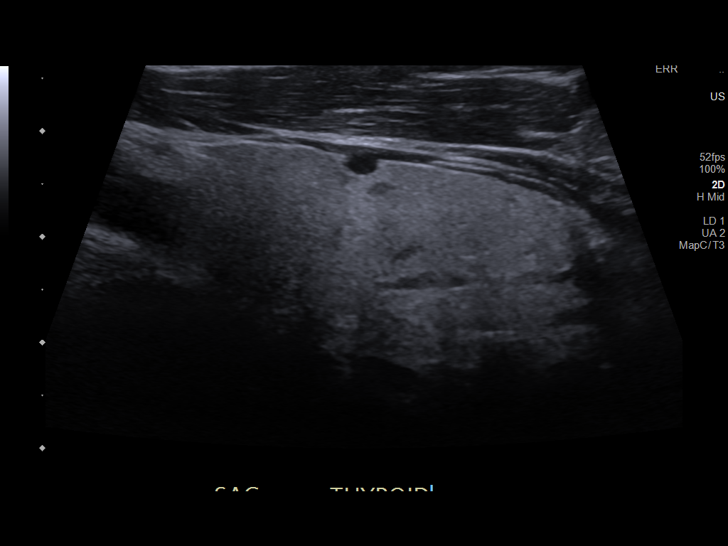
[im 21/32]
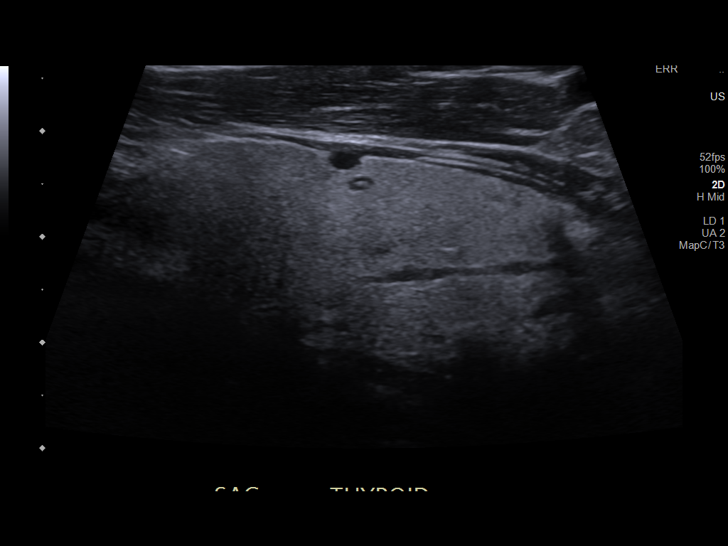
[im 24/32]
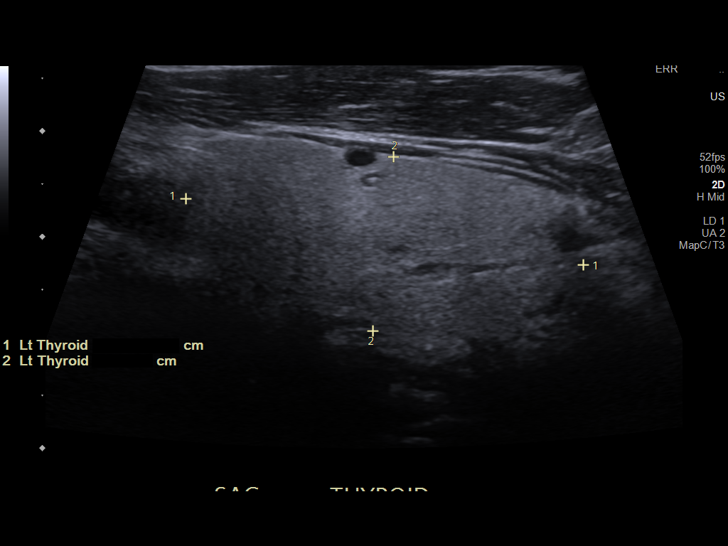
[im 26/32]
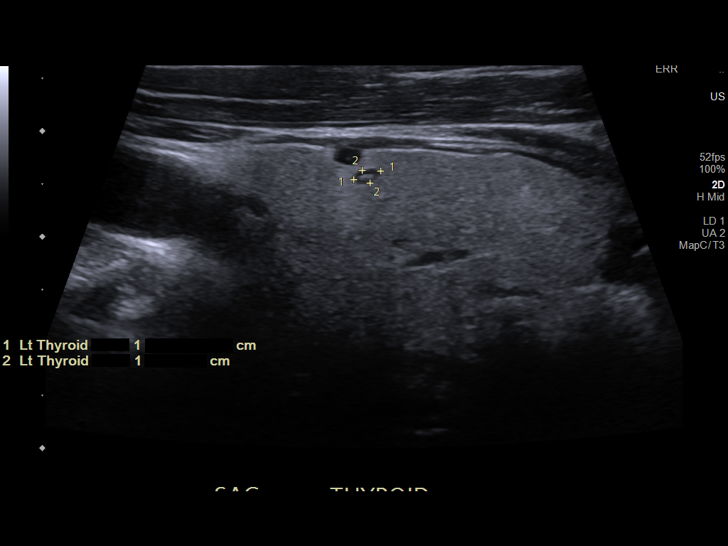
[im 29/32]
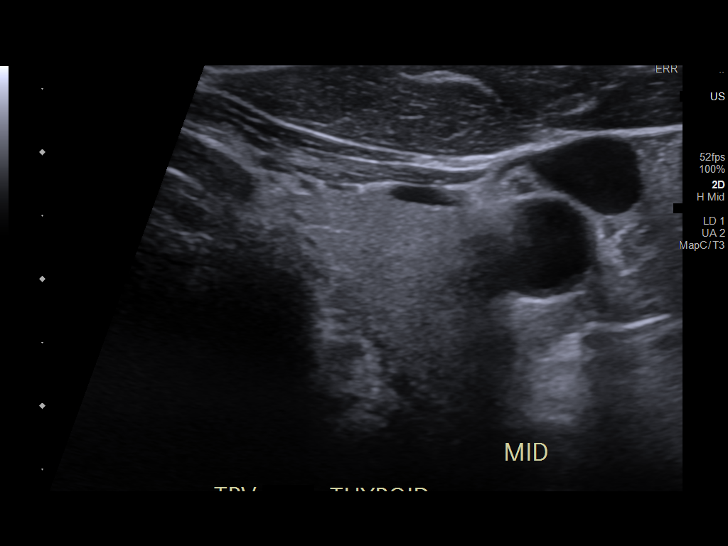
[im 32/32]
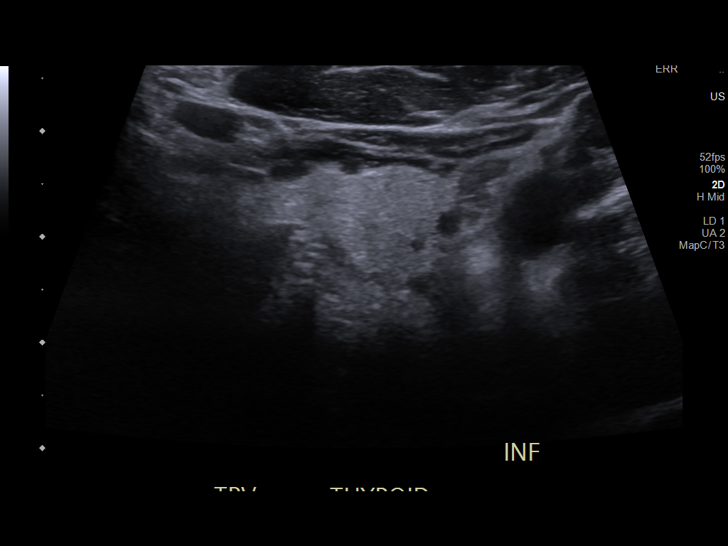

[14 of 25 positions shown; findings below may reference images not displayed]

FINDINGS: Parenchymal Echotexture: Normal

Isthmus: 0.5 cm

Right lobe: 4.7 x 1.3 x 1.8 cm

Left lobe: 3.8 x 1.7 x 1.3 cm

_________________________________________________________

Estimated total number of nodules >/= 1 cm: 0

Number of spongiform nodules >/=  2 cm not described below (TR1): 0

Number of mixed cystic and solid nodules >/= 1.5 cm not described
below (TR2): 0

_________________________________________________________

There are a few scattered tiny thyroid cysts of 3 mm or less in
diameter. No abnormal parathyroid nodules or abnormal lymph nodes
are visualized by ultrasound.
IMPRESSION: Unremarkable thyroid ultrasound. No visualized parathyroid nodules
or abnormal lymph nodes by ultrasound.

The above is in keeping with the ACR TI-RADS recommendations - [HOSPITAL] 0235;[DATE].

## 2022-06-30 DIAGNOSIS — Z23 Encounter for immunization: Secondary | ICD-10-CM | POA: Diagnosis not present

## 2022-08-09 DIAGNOSIS — I10 Essential (primary) hypertension: Secondary | ICD-10-CM | POA: Diagnosis not present

## 2022-08-09 DIAGNOSIS — R059 Cough, unspecified: Secondary | ICD-10-CM | POA: Diagnosis not present

## 2022-08-09 DIAGNOSIS — J45901 Unspecified asthma with (acute) exacerbation: Secondary | ICD-10-CM | POA: Diagnosis not present

## 2022-08-09 DIAGNOSIS — K219 Gastro-esophageal reflux disease without esophagitis: Secondary | ICD-10-CM | POA: Diagnosis not present

## 2022-08-09 DIAGNOSIS — R030 Elevated blood-pressure reading, without diagnosis of hypertension: Secondary | ICD-10-CM | POA: Diagnosis not present

## 2022-08-09 DIAGNOSIS — R55 Syncope and collapse: Secondary | ICD-10-CM | POA: Diagnosis not present

## 2022-08-10 DIAGNOSIS — D649 Anemia, unspecified: Secondary | ICD-10-CM | POA: Diagnosis not present

## 2022-08-10 DIAGNOSIS — D529 Folate deficiency anemia, unspecified: Secondary | ICD-10-CM | POA: Diagnosis not present

## 2022-08-10 DIAGNOSIS — D519 Vitamin B12 deficiency anemia, unspecified: Secondary | ICD-10-CM | POA: Diagnosis not present

## 2022-08-12 DIAGNOSIS — I469 Cardiac arrest, cause unspecified: Secondary | ICD-10-CM | POA: Diagnosis not present

## 2022-08-12 DIAGNOSIS — R5381 Other malaise: Secondary | ICD-10-CM | POA: Diagnosis not present

## 2022-08-27 ENCOUNTER — Other Ambulatory Visit: Payer: Self-pay | Admitting: Urology

## 2022-08-30 DEATH — deceased

## 2022-09-01 ENCOUNTER — Ambulatory Visit: Payer: Medicare Other | Admitting: Urology
